# Patient Record
Sex: Female | Born: 1945 | Race: White | Hispanic: No | Smoking: Never smoker
Health system: Southern US, Community
[De-identification: ages and names within clinical notes are randomized; demographics above are authoritative.]

## PROBLEM LIST (undated history)

## (undated) DIAGNOSIS — K219 Gastro-esophageal reflux disease without esophagitis: Secondary | ICD-10-CM

## (undated) DIAGNOSIS — E7800 Pure hypercholesterolemia, unspecified: Secondary | ICD-10-CM

## (undated) DIAGNOSIS — F32A Depression, unspecified: Secondary | ICD-10-CM

## (undated) DIAGNOSIS — J302 Other seasonal allergic rhinitis: Secondary | ICD-10-CM

## (undated) DIAGNOSIS — J45909 Unspecified asthma, uncomplicated: Secondary | ICD-10-CM

## (undated) DIAGNOSIS — Z91018 Allergy to other foods: Secondary | ICD-10-CM

## (undated) DIAGNOSIS — G709 Myoneural disorder, unspecified: Secondary | ICD-10-CM

## (undated) DIAGNOSIS — F329 Major depressive disorder, single episode, unspecified: Secondary | ICD-10-CM

## (undated) DIAGNOSIS — R0602 Shortness of breath: Secondary | ICD-10-CM

## (undated) DIAGNOSIS — R112 Nausea with vomiting, unspecified: Secondary | ICD-10-CM

## (undated) DIAGNOSIS — M1990 Unspecified osteoarthritis, unspecified site: Secondary | ICD-10-CM

## (undated) DIAGNOSIS — Z9889 Other specified postprocedural states: Secondary | ICD-10-CM

## (undated) HISTORY — PX: DILATION AND CURETTAGE OF UTERUS: SHX78

---

## 1972-11-02 HISTORY — PX: GROIN MASS OPEN BIOPSY: SHX1714

## 1995-11-03 HISTORY — PX: APPENDECTOMY: SHX54

## 1995-11-03 HISTORY — PX: ABDOMINAL HYSTERECTOMY: SHX81

## 1997-11-02 HISTORY — PX: REFRACTIVE SURGERY: SHX103

## 2012-10-19 ENCOUNTER — Other Ambulatory Visit: Payer: Self-pay | Admitting: Orthopedic Surgery

## 2012-10-20 ENCOUNTER — Encounter (HOSPITAL_COMMUNITY): Payer: Self-pay

## 2012-10-20 ENCOUNTER — Encounter (HOSPITAL_COMMUNITY)
Admission: RE | Admit: 2012-10-20 | Discharge: 2012-10-20 | Disposition: A | Discharge status: Home / Self Care | Payer: Medicare Other | Source: Ambulatory Visit | Attending: Orthopedic Surgery | Admitting: Orthopedic Surgery

## 2012-10-20 ENCOUNTER — Ambulatory Visit (HOSPITAL_COMMUNITY)
Admission: RE | Admit: 2012-10-20 | Discharge: 2012-10-20 | Disposition: A | Discharge status: Home / Self Care | Payer: Medicare Other | Source: Ambulatory Visit | Attending: Orthopedic Surgery | Admitting: Orthopedic Surgery

## 2012-10-20 DIAGNOSIS — Z01810 Encounter for preprocedural cardiovascular examination: Secondary | ICD-10-CM | POA: Insufficient documentation

## 2012-10-20 DIAGNOSIS — M1710 Unilateral primary osteoarthritis, unspecified knee: Secondary | ICD-10-CM | POA: Insufficient documentation

## 2012-10-20 DIAGNOSIS — F17200 Nicotine dependence, unspecified, uncomplicated: Secondary | ICD-10-CM | POA: Insufficient documentation

## 2012-10-20 DIAGNOSIS — Z01812 Encounter for preprocedural laboratory examination: Secondary | ICD-10-CM | POA: Insufficient documentation

## 2012-10-20 HISTORY — DX: Unspecified osteoarthritis, unspecified site: M19.90

## 2012-10-20 HISTORY — DX: Other specified postprocedural states: R11.2

## 2012-10-20 HISTORY — DX: Myoneural disorder, unspecified: G70.9

## 2012-10-20 HISTORY — DX: Other specified postprocedural states: Z98.890

## 2012-10-20 HISTORY — DX: Nausea with vomiting, unspecified: R11.2

## 2012-10-20 LAB — URINALYSIS, ROUTINE W REFLEX MICROSCOPIC
Ketones, ur: NEGATIVE mg/dL
Leukocytes, UA: NEGATIVE
Nitrite: NEGATIVE
Protein, ur: NEGATIVE mg/dL
pH: 7.5 (ref 5.0–8.0)

## 2012-10-20 LAB — CBC WITH DIFFERENTIAL/PLATELET
Basophils Absolute: 0.1 10*3/uL (ref 0.0–0.1)
Eosinophils Absolute: 0.1 10*3/uL (ref 0.0–0.7)
Eosinophils Relative: 2 % (ref 0–5)
HCT: 41.6 % (ref 36.0–46.0)
Lymphocytes Relative: 27 % (ref 12–46)
MCH: 30 pg (ref 26.0–34.0)
MCHC: 33.7 g/dL (ref 30.0–36.0)
MCV: 89.1 fL (ref 78.0–100.0)
Monocytes Absolute: 0.5 10*3/uL (ref 0.1–1.0)
RDW: 12.8 % (ref 11.5–15.5)

## 2012-10-20 LAB — ABO/RH: ABO/RH(D): O POS

## 2012-10-20 LAB — COMPREHENSIVE METABOLIC PANEL
AST: 20 U/L (ref 0–37)
CO2: 29 mEq/L (ref 19–32)
Calcium: 9.7 mg/dL (ref 8.4–10.5)
Creatinine, Ser: 0.67 mg/dL (ref 0.50–1.10)
GFR calc Af Amer: >90 mL/min (ref >90)
GFR calc non Af Amer: 90 mL/min — ABNORMAL LOW (ref >90)
Total Protein: 7.2 g/dL (ref 6.0–8.3)

## 2012-10-20 LAB — TYPE AND SCREEN
ABO/RH(D): O POS
Antibody Screen: NEGATIVE

## 2012-10-20 LAB — PROTIME-INR: INR: 0.92 (ref 0.00–1.49)

## 2012-10-20 LAB — SURGICAL PCR SCREEN: MRSA, PCR: NEGATIVE

## 2012-10-20 MED ORDER — CHLORHEXIDINE GLUCONATE 4 % EX LIQD: 60.0000 mL | Freq: Once | CUTANEOUS | Status: DC

## 2012-10-20 NOTE — Pre-Procedure Instructions (Signed)
20 Annette Tran  10/20/2012   Your procedure is scheduled on:  Monday October 24, 2012  Report to [facility] at 6:45 AM.  Call this number if you have problems the morning of surgery: [phone]   Remember:   Do not eat food or drink :After Midnight.    Take these medicines the morning of surgery with A SIP OF WATER: none   Do not wear jewelry, make-up or nail polish.  Do not wear lotions, powders, or perfumes.   Do not shave 48 hours prior to surgery.   Do not bring valuables to the hospital.  Contacts, dentures or bridgework may not be worn into surgery.  Leave suitcase in the car. After surgery it may be brought to your room.  For patients admitted to the hospital, checkout time is 11:00 AM the day of discharge.   Patients discharged the day of surgery will not be allowed to drive home.  Name and phone number of your driver: family / friend  Special Instructions: Shower using CHG 2 nights before surgery and the night before surgery.  If you shower the day of surgery use CHG.  Use special wash - you have one bottle of CHG for all showers.  You should use approximately 1/3 of the bottle for each shower.   Please read over the following fact sheets that you were given: Pain Booklet, Coughing and Deep Breathing, Blood Transfusion Information, Total Joint Packet, MRSA Information and Surgical Site Infection Prevention

## 2012-10-23 MED ORDER — CEFAZOLIN SODIUM-DEXTROSE 2-3 GM-% IV SOLR
2.0000 g | INTRAVENOUS | Status: AC
  Administered 2012-10-24: 2 g via INTRAVENOUS

## 2012-10-24 ENCOUNTER — Inpatient Hospital Stay (HOSPITAL_COMMUNITY)
Admission: RE | Admit: 2012-10-24 | Discharge: 2012-10-26 | DRG: 470 | Disposition: A | Discharge status: Home Health | Payer: Medicare Other | Source: Ambulatory Visit | Attending: Orthopedic Surgery | Admitting: Orthopedic Surgery

## 2012-10-24 ENCOUNTER — Encounter (HOSPITAL_COMMUNITY): Payer: Self-pay | Admitting: Anesthesiology

## 2012-10-24 ENCOUNTER — Encounter (HOSPITAL_COMMUNITY): Payer: Self-pay | Admitting: *Deleted

## 2012-10-24 ENCOUNTER — Ambulatory Visit (HOSPITAL_COMMUNITY): Payer: Medicare Other | Admitting: Anesthesiology

## 2012-10-24 ENCOUNTER — Encounter (HOSPITAL_COMMUNITY)
Admission: RE | Disposition: A | Discharge status: Home Health | Payer: Self-pay | Source: Ambulatory Visit | Attending: Orthopedic Surgery

## 2012-10-24 DIAGNOSIS — Z87891 Personal history of nicotine dependence: Secondary | ICD-10-CM

## 2012-10-24 DIAGNOSIS — D62 Acute posthemorrhagic anemia: Secondary | ICD-10-CM | POA: Diagnosis not present

## 2012-10-24 DIAGNOSIS — Z90710 Acquired absence of both cervix and uterus: Secondary | ICD-10-CM

## 2012-10-24 DIAGNOSIS — Z881 Allergy status to other antibiotic agents status: Secondary | ICD-10-CM

## 2012-10-24 DIAGNOSIS — Z79899 Other long term (current) drug therapy: Secondary | ICD-10-CM

## 2012-10-24 DIAGNOSIS — M1711 Unilateral primary osteoarthritis, right knee: Secondary | ICD-10-CM

## 2012-10-24 DIAGNOSIS — Z885 Allergy status to narcotic agent status: Secondary | ICD-10-CM

## 2012-10-24 DIAGNOSIS — M1710 Unilateral primary osteoarthritis, unspecified knee: Principal | ICD-10-CM | POA: Diagnosis present

## 2012-10-24 HISTORY — PX: TOTAL KNEE ARTHROPLASTY: SHX125

## 2012-10-24 SURGERY — ARTHROPLASTY, KNEE, TOTAL
Anesthesia: Regional | Site: Knee | Laterality: Right | Wound class: Clean

## 2012-10-24 MED ORDER — DOCUSATE SODIUM 100 MG PO CAPS
100.0000 mg | ORAL_CAPSULE | Freq: Two times a day (BID) | ORAL | Status: DC
  Administered 2012-10-24 – 2012-10-26 (×5): 100 mg via ORAL
  Filled 2012-10-24 (×4): qty 1

## 2012-10-24 MED ORDER — BUPIVACAINE 0.25 % ON-Q PUMP SINGLE CATH 300ML
300.0000 mL | INJECTION | Status: DC
  Filled 2012-10-24: qty 300

## 2012-10-24 MED ORDER — ENOXAPARIN SODIUM 30 MG/0.3ML ~~LOC~~ SOLN
30.0000 mg | Freq: Two times a day (BID) | SUBCUTANEOUS | Status: DC
  Administered 2012-10-25 – 2012-10-26 (×3): 30 mg via SUBCUTANEOUS
  Filled 2012-10-24 (×5): qty 0.3

## 2012-10-24 MED ORDER — ONDANSETRON HCL 4 MG/2ML IJ SOLN
4.0000 mg | Freq: Four times a day (QID) | INTRAMUSCULAR | Status: DC | PRN
  Administered 2012-10-24 – 2012-10-26 (×2): 4 mg via INTRAVENOUS
  Filled 2012-10-24 (×2): qty 2

## 2012-10-24 MED ORDER — ACETAMINOPHEN 10 MG/ML IV SOLN
1000.0000 mg | Freq: Four times a day (QID) | INTRAVENOUS | Status: AC
  Administered 2012-10-24 – 2012-10-25 (×3): 1000 mg via INTRAVENOUS
  Filled 2012-10-24 (×4): qty 100

## 2012-10-24 MED ORDER — ACETAMINOPHEN 10 MG/ML IV SOLN
INTRAVENOUS | Status: AC
  Filled 2012-10-24: qty 100

## 2012-10-24 MED ORDER — FENTANYL CITRATE 0.05 MG/ML IJ SOLN
INTRAMUSCULAR | Status: DC | PRN
  Administered 2012-10-24 (×5): 50 ug via INTRAVENOUS

## 2012-10-24 MED ORDER — FLEET ENEMA 7-19 GM/118ML RE ENEM: 1.0000 | ENEMA | Freq: Once | RECTAL | Status: AC | PRN

## 2012-10-24 MED ORDER — OXYCODONE HCL 5 MG PO TABS
5.0000 mg | ORAL_TABLET | ORAL | Status: DC | PRN
  Administered 2012-10-25 – 2012-10-26 (×9): 10 mg via ORAL
  Filled 2012-10-24 (×9): qty 2

## 2012-10-24 MED ORDER — CELECOXIB 200 MG PO CAPS
200.0000 mg | ORAL_CAPSULE | Freq: Two times a day (BID) | ORAL | Status: DC
  Administered 2012-10-24 – 2012-10-26 (×4): 200 mg via ORAL
  Filled 2012-10-24 (×5): qty 1

## 2012-10-24 MED ORDER — PROPOFOL 10 MG/ML IV BOLUS
INTRAVENOUS | Status: DC | PRN
  Administered 2012-10-24: 180 mg via INTRAVENOUS

## 2012-10-24 MED ORDER — SODIUM CHLORIDE 0.9 % IR SOLN
Status: DC | PRN
  Administered 2012-10-24: 3000 mL

## 2012-10-24 MED ORDER — OXYCODONE HCL 5 MG PO TABS
ORAL_TABLET | ORAL | Status: AC
  Filled 2012-10-24: qty 1

## 2012-10-24 MED ORDER — LACTATED RINGERS IV SOLN
INTRAVENOUS | Status: DC | PRN
  Administered 2012-10-24 (×2): via INTRAVENOUS

## 2012-10-24 MED ORDER — SODIUM CHLORIDE 0.9 % IV SOLN: INTRAVENOUS | Status: DC

## 2012-10-24 MED ORDER — ALUM & MAG HYDROXIDE-SIMETH 200-200-20 MG/5ML PO SUSP
30.0000 mL | ORAL | Status: DC | PRN
  Filled 2012-10-24: qty 30

## 2012-10-24 MED ORDER — SENNOSIDES-DOCUSATE SODIUM 8.6-50 MG PO TABS: 1.0000 | ORAL_TABLET | Freq: Every evening | ORAL | Status: DC | PRN

## 2012-10-24 MED ORDER — BUPIVACAINE-EPINEPHRINE PF 0.5-1:200000 % IJ SOLN
INTRAMUSCULAR | Status: DC | PRN
  Administered 2012-10-24: 30 mL

## 2012-10-24 MED ORDER — LIDOCAINE HCL (CARDIAC) 20 MG/ML IV SOLN
INTRAVENOUS | Status: DC | PRN
  Administered 2012-10-24: 50 mg via INTRAVENOUS

## 2012-10-24 MED ORDER — BISACODYL 5 MG PO TBEC
5.0000 mg | DELAYED_RELEASE_TABLET | Freq: Every day | ORAL | Status: DC | PRN
  Administered 2012-10-25: 5 mg via ORAL
  Filled 2012-10-24: qty 1

## 2012-10-24 MED ORDER — SODIUM CHLORIDE 0.9 % IV SOLN
INTRAVENOUS | Status: DC
Start: 2012-10-24 — End: 2012-10-26
  Administered 2012-10-24 – 2012-10-25 (×3): via INTRAVENOUS

## 2012-10-24 MED ORDER — CEFAZOLIN SODIUM-DEXTROSE 2-3 GM-% IV SOLR
INTRAVENOUS | Status: AC
  Filled 2012-10-24: qty 50

## 2012-10-24 MED ORDER — CEFAZOLIN SODIUM-DEXTROSE 2-3 GM-% IV SOLR
2.0000 g | Freq: Four times a day (QID) | INTRAVENOUS | Status: AC
  Administered 2012-10-24 (×2): 2 g via INTRAVENOUS
  Filled 2012-10-24 (×2): qty 50

## 2012-10-24 MED ORDER — OXYCODONE HCL 5 MG PO TABS
5.0000 mg | ORAL_TABLET | Freq: Once | ORAL | Status: AC | PRN
  Administered 2012-10-24: 5 mg via ORAL

## 2012-10-24 MED ORDER — ONDANSETRON HCL 4 MG/2ML IJ SOLN: 4.0000 mg | Freq: Once | INTRAMUSCULAR | Status: DC | PRN

## 2012-10-24 MED ORDER — METHOCARBAMOL 500 MG PO TABS
500.0000 mg | ORAL_TABLET | Freq: Four times a day (QID) | ORAL | Status: DC | PRN
  Administered 2012-10-25 – 2012-10-26 (×4): 500 mg via ORAL
  Filled 2012-10-24 (×5): qty 1

## 2012-10-24 MED ORDER — ACETAMINOPHEN 10 MG/ML IV SOLN
1000.0000 mg | Freq: Four times a day (QID) | INTRAVENOUS | Status: DC
  Administered 2012-10-24: 1000 mg via INTRAVENOUS
  Filled 2012-10-24 (×3): qty 100

## 2012-10-24 MED ORDER — EPHEDRINE SULFATE 50 MG/ML IJ SOLN
INTRAMUSCULAR | Status: DC | PRN
  Administered 2012-10-24: 5 mg via INTRAVENOUS

## 2012-10-24 MED ORDER — CHLORHEXIDINE GLUCONATE 4 % EX LIQD: 60.0000 mL | Freq: Once | CUTANEOUS | Status: DC

## 2012-10-24 MED ORDER — METOCLOPRAMIDE HCL 10 MG PO TABS
5.0000 mg | ORAL_TABLET | Freq: Three times a day (TID) | ORAL | Status: DC | PRN
  Administered 2012-10-25: 10 mg via ORAL
  Filled 2012-10-24: qty 1

## 2012-10-24 MED ORDER — HYDROMORPHONE HCL PF 1 MG/ML IJ SOLN
0.2500 mg | INTRAMUSCULAR | Status: DC | PRN
  Administered 2012-10-24 (×2): 0.5 mg via INTRAVENOUS

## 2012-10-24 MED ORDER — DIPHENHYDRAMINE HCL 12.5 MG/5ML PO ELIX
12.5000 mg | ORAL_SOLUTION | ORAL | Status: DC | PRN
  Administered 2012-10-25: 25 mg via ORAL
  Filled 2012-10-24: qty 50

## 2012-10-24 MED ORDER — OXYCODONE HCL 5 MG/5ML PO SOLN: 5.0000 mg | Freq: Once | ORAL | Status: AC | PRN

## 2012-10-24 MED ORDER — ONDANSETRON HCL 4 MG PO TABS: 4.0000 mg | ORAL_TABLET | Freq: Four times a day (QID) | ORAL | Status: DC | PRN

## 2012-10-24 MED ORDER — ACETAMINOPHEN 650 MG RE SUPP: 650.0000 mg | Freq: Four times a day (QID) | RECTAL | Status: DC | PRN

## 2012-10-24 MED ORDER — MEPERIDINE HCL 25 MG/ML IJ SOLN: 6.2500 mg | INTRAMUSCULAR | Status: DC | PRN

## 2012-10-24 MED ORDER — BUPIVACAINE-EPINEPHRINE 0.25% -1:200000 IJ SOLN
INTRAMUSCULAR | Status: DC | PRN
  Administered 2012-10-24: 30 mL

## 2012-10-24 MED ORDER — PHENOL 1.4 % MT LIQD: 1.0000 | OROMUCOSAL | Status: DC | PRN

## 2012-10-24 MED ORDER — HYDROMORPHONE HCL PF 1 MG/ML IJ SOLN
0.5000 mg | INTRAMUSCULAR | Status: DC | PRN
  Administered 2012-10-24 – 2012-10-26 (×3): 0.5 mg via INTRAVENOUS
  Filled 2012-10-24 (×3): qty 1

## 2012-10-24 MED ORDER — ACETAMINOPHEN 325 MG PO TABS: 650.0000 mg | ORAL_TABLET | Freq: Four times a day (QID) | ORAL | Status: DC | PRN

## 2012-10-24 MED ORDER — HYDROMORPHONE HCL PF 1 MG/ML IJ SOLN
INTRAMUSCULAR | Status: AC
  Administered 2012-10-24: 0.5 mg via INTRAVENOUS
  Filled 2012-10-24: qty 1

## 2012-10-24 MED ORDER — METOCLOPRAMIDE HCL 5 MG/ML IJ SOLN
5.0000 mg | Freq: Three times a day (TID) | INTRAMUSCULAR | Status: DC | PRN
  Administered 2012-10-24: 10 mg via INTRAVENOUS
  Filled 2012-10-24: qty 2

## 2012-10-24 MED ORDER — BUPIVACAINE 0.25 % ON-Q PUMP SINGLE CATH 300ML
INJECTION | Status: DC | PRN
  Administered 2012-10-24: 300 mL

## 2012-10-24 MED ORDER — ONDANSETRON HCL 4 MG/2ML IJ SOLN
INTRAMUSCULAR | Status: DC | PRN
  Administered 2012-10-24: 4 mg via INTRAVENOUS

## 2012-10-24 MED ORDER — PHENYLEPHRINE HCL 10 MG/ML IJ SOLN
INTRAMUSCULAR | Status: DC | PRN
  Administered 2012-10-24 (×6): 40 ug via INTRAVENOUS

## 2012-10-24 MED ORDER — MIDAZOLAM HCL 5 MG/5ML IJ SOLN
INTRAMUSCULAR | Status: DC | PRN
  Administered 2012-10-24: 2 mg via INTRAVENOUS

## 2012-10-24 MED ORDER — BUPIVACAINE ON-Q PAIN PUMP (FOR ORDER SET NO CHG)
INJECTION | Status: DC
  Filled 2012-10-24: qty 1

## 2012-10-24 MED ORDER — 0.9 % SODIUM CHLORIDE (POUR BTL) OPTIME
TOPICAL | Status: DC | PRN
  Administered 2012-10-24: 1000 mL

## 2012-10-24 MED ORDER — DEXTROSE 5 % IV SOLN
500.0000 mg | Freq: Four times a day (QID) | INTRAVENOUS | Status: DC | PRN
  Administered 2012-10-24: 500 mg via INTRAVENOUS
  Filled 2012-10-24: qty 5

## 2012-10-24 MED ORDER — MENTHOL 3 MG MT LOZG
1.0000 | LOZENGE | OROMUCOSAL | Status: DC | PRN
  Filled 2012-10-24: qty 9

## 2012-10-24 MED ORDER — BUPIVACAINE-EPINEPHRINE PF 0.25-1:200000 % IJ SOLN
INTRAMUSCULAR | Status: AC
  Filled 2012-10-24: qty 30

## 2012-10-24 MED ORDER — ZOLPIDEM TARTRATE 5 MG PO TABS: 5.0000 mg | ORAL_TABLET | Freq: Every evening | ORAL | Status: DC | PRN

## 2012-10-24 MED ORDER — OXYCODONE HCL ER 10 MG PO T12A
10.0000 mg | EXTENDED_RELEASE_TABLET | Freq: Two times a day (BID) | ORAL | Status: DC
  Administered 2012-10-24 – 2012-10-26 (×5): 10 mg via ORAL
  Filled 2012-10-24 (×5): qty 1

## 2012-10-24 SURGICAL SUPPLY — 62 items
BANDAGE ELASTIC 6 VELCRO ST LF (GAUZE/BANDAGES/DRESSINGS) ×2 IMPLANT
BANDAGE ESMARK 6X9 LF (GAUZE/BANDAGES/DRESSINGS) ×1 IMPLANT
BLADE SAGITTAL 13X1.27X60 (BLADE) ×2 IMPLANT
BLADE SAW SGTL 83.5X18.5 (BLADE) ×2 IMPLANT
BNDG ESMARK 6X9 LF (GAUZE/BANDAGES/DRESSINGS) ×2
BOWL SMART MIX CTS (DISPOSABLE) ×2 IMPLANT
CATH KIT ON Q 5IN SLV (PAIN MANAGEMENT) ×2 IMPLANT
CEMENT BONE SIMPLEX SPEEDSET (Cement) ×4 IMPLANT
CLOTH BEACON ORANGE TIMEOUT ST (SAFETY) ×2 IMPLANT
COVER BACK TABLE 24X17X13 BIG (DRAPES) IMPLANT
COVER SURGICAL LIGHT HANDLE (MISCELLANEOUS) ×2 IMPLANT
CUFF TOURNIQUET SINGLE 34IN LL (TOURNIQUET CUFF) ×2 IMPLANT
DRAPE EXTREMITY T 121X128X90 (DRAPE) ×2 IMPLANT
DRAPE INCISE IOBAN 66X45 STRL (DRAPES) ×4 IMPLANT
DRAPE PROXIMA HALF (DRAPES) ×2 IMPLANT
DRAPE U-SHAPE 47X51 STRL (DRAPES) ×2 IMPLANT
DRSG ADAPTIC 3X8 NADH LF (GAUZE/BANDAGES/DRESSINGS) ×2 IMPLANT
DRSG PAD ABDOMINAL 8X10 ST (GAUZE/BANDAGES/DRESSINGS) ×2 IMPLANT
DURAPREP 26ML APPLICATOR (WOUND CARE) ×4 IMPLANT
ELECT REM PT RETURN 9FT ADLT (ELECTROSURGICAL) ×2
ELECTRODE REM PT RTRN 9FT ADLT (ELECTROSURGICAL) ×1 IMPLANT
EVACUATOR 1/8 PVC DRAIN (DRAIN) ×2 IMPLANT
GLOVE BIOGEL M 7.0 STRL (GLOVE) IMPLANT
GLOVE BIOGEL PI IND STRL 7.5 (GLOVE) IMPLANT
GLOVE BIOGEL PI IND STRL 8.5 (GLOVE) ×2 IMPLANT
GLOVE BIOGEL PI INDICATOR 7.5 (GLOVE)
GLOVE BIOGEL PI INDICATOR 8.5 (GLOVE) ×2
GLOVE BIOGEL PI ORTHO PRO 7.5 (GLOVE) ×1
GLOVE BIOGEL PI ORTHO PRO SZ8 (GLOVE)
GLOVE PI ORTHO PRO STRL 7.5 (GLOVE) ×1 IMPLANT
GLOVE PI ORTHO PRO STRL SZ8 (GLOVE) IMPLANT
GLOVE SURG ORTHO 8.0 STRL STRW (GLOVE) ×8 IMPLANT
GLOVE SURG SS PI 7.5 STRL IVOR (GLOVE) ×6 IMPLANT
GOWN PREVENTION PLUS XLARGE (GOWN DISPOSABLE) ×4 IMPLANT
GOWN STRL NON-REIN LRG LVL3 (GOWN DISPOSABLE) IMPLANT
GOWN STRL REIN XL XLG (GOWN DISPOSABLE) ×2 IMPLANT
HANDPIECE INTERPULSE COAX TIP (DISPOSABLE) ×1
HOOD PEEL AWAY FACE SHEILD DIS (HOOD) ×6 IMPLANT
KIT BASIN OR (CUSTOM PROCEDURE TRAY) ×2 IMPLANT
KIT ROOM TURNOVER OR (KITS) ×2 IMPLANT
MANIFOLD NEPTUNE II (INSTRUMENTS) ×2 IMPLANT
NEEDLE 22X1 1/2 (OR ONLY) (NEEDLE) ×2 IMPLANT
NS IRRIG 1000ML POUR BTL (IV SOLUTION) ×2 IMPLANT
PACK TOTAL JOINT (CUSTOM PROCEDURE TRAY) ×2 IMPLANT
PAD ARMBOARD 7.5X6 YLW CONV (MISCELLANEOUS) ×2 IMPLANT
PADDING CAST COTTON 6X4 STRL (CAST SUPPLIES) ×2 IMPLANT
POSITIONER HEAD PRONE TRACH (MISCELLANEOUS) ×2 IMPLANT
SET HNDPC FAN SPRY TIP SCT (DISPOSABLE) ×1 IMPLANT
SPONGE GAUZE 4X4 12PLY (GAUZE/BANDAGES/DRESSINGS) ×2 IMPLANT
STAPLER VISISTAT 35W (STAPLE) ×2 IMPLANT
SUCTION FRAZIER TIP 10 FR DISP (SUCTIONS) ×2 IMPLANT
SUT BONE WAX W31G (SUTURE) ×2 IMPLANT
SUT VIC AB 0 CTB1 27 (SUTURE) ×4 IMPLANT
SUT VIC AB 1 CT1 27 (SUTURE) ×2
SUT VIC AB 1 CT1 27XBRD ANBCTR (SUTURE) ×2 IMPLANT
SUT VIC AB 2-0 CT1 27 (SUTURE) ×2
SUT VIC AB 2-0 CT1 TAPERPNT 27 (SUTURE) ×2 IMPLANT
SYR CONTROL 10ML LL (SYRINGE) IMPLANT
TOWEL OR 17X24 6PK STRL BLUE (TOWEL DISPOSABLE) ×2 IMPLANT
TOWEL OR 17X26 10 PK STRL BLUE (TOWEL DISPOSABLE) ×2 IMPLANT
TRAY FOLEY CATH 14FR (SET/KITS/TRAYS/PACK) ×2 IMPLANT
WATER STERILE IRR 1000ML POUR (IV SOLUTION) ×4 IMPLANT

## 2012-10-24 NOTE — Anesthesia Preprocedure Evaluation (Addendum)
Anesthesia Evaluation  Patient identified by MRN, date of birth, ID band Patient awake    Reviewed: Allergy & Precautions, H&P , NPO status , Patient's Chart, lab work & pertinent test results  History of Anesthesia Complications (+) PONV  Airway Mallampati: I TM Distance: >3 FB Neck ROM: Full    Dental  (+) Teeth Intact and Dental Advisory Given   Pulmonary Current Smoker,  breath sounds clear to auscultation        Cardiovascular Rhythm:Regular Rate:Normal     Neuro/Psych  Neuromuscular disease negative psych ROS   GI/Hepatic negative GI ROS, Neg liver ROS,   Endo/Other  negative endocrine ROS  Renal/GU negative Renal ROS     Musculoskeletal  (+) Arthritis -, Osteoarthritis,    Abdominal (+)  Abdomen: soft. Bowel sounds: normal.  Peds  Hematology negative hematology ROS (+)   Anesthesia Other Findings   Reproductive/Obstetrics negative OB ROS                        Anesthesia Physical Anesthesia Plan  ASA: II  Anesthesia Plan: General   Post-op Pain Management:    Induction: Intravenous  Airway Management Planned: Oral ETT  Additional Equipment:   Intra-op Plan:   Post-operative Plan: Extubation in OR  Informed Consent: I have reviewed the patients History and Physical, chart, labs and discussed the procedure including the risks, benefits and alternatives for the proposed anesthesia with the patient or authorized representative who has indicated his/her understanding and acceptance.   Dental advisory given  Plan Discussed with: Surgeon and CRNA  Anesthesia Plan Comments:        Anesthesia Quick Evaluation

## 2012-10-24 NOTE — Preoperative (Signed)
Beta Blockers   Reason not to administer Beta Blockers:Not Applicable 

## 2012-10-24 NOTE — Op Note (Signed)
TOTAL KNEE REPLACEMENT OPERATIVE NOTE:  10/24/2012  10:46 AM  PATIENT:  Annette Tran  66 y.o. female  PRE-OPERATIVE DIAGNOSIS:  osteoarthrits right knee  POST-OPERATIVE DIAGNOSIS:  osteoarthrits right knee  PROCEDURE:  Procedure(s): TOTAL KNEE ARTHROPLASTY  SURGEON:  Surgeon(s): Dannielle Huh, MD  PHYSICIAN ASSISTANT: Altamese Cabal, [facility]  ANESTHESIA:   general  DRAINS: Hemovac and On-Q Marcaine Pain Pump  SPECIMEN: None  COUNTS:  Correct  TOURNIQUET:   Total Tourniquet Time Documented: Thigh (Right) - 48 minutes  DICTATION:  Indication for procedure:    The patient is a 66 y.o. female who has failed conservative treatment for osteoarthrits right knee.  Informed consent was obtained prior to anesthesia. The risks versus benefits of the operation were explain and in a way the patient can, and did, understand.   Description of procedure:     The patient was taken to the operating room and placed under anesthesia.  The patient was positioned in the usual fashion taking care that all body parts were adequately padded and/or protected.  I foley catheter was placed.  A tourniquet was applied and the leg prepped and draped in the usual sterile fashion.  The extremity was exsanguinated with the esmarch and tourniquet inflated to 350 mmHg.  Pre-operative range of motion was normal.  The knee was in 5 degree of mild varus.  A midline incision approximately 6-7 inches long was made with a #10 blade.  A new blade was used to make a parapatellar arthrotomy going 2-3 cm into the quadriceps tendon, over the patella, and alongside the medial aspect of the patellar tendon.  A synovectomy was then performed with the #10 blade and forceps. I then elevated the deep MCL off the medial tibial metaphysis subperiosteally around to the semimembranosus attachment.    I everted the patella and used calipers to measure patellar thickness.  I used the reamer to ream down to appropriate thickness to  recreate the native thickness.  I then removed excess bone with the rongeur and sagittal saw.  I used the appropriately sized template and drilled the three lug holes.  I then put the trial in place and measured the thickness with the calipers to ensure recreation of the native thickness.  The trial was then removed and the patella subluxed and the knee brought into flexion.  A homan retractor was place to retract and protect the patella and lateral structures.  A Z-retractor was place medially to protect the medial structures.  The extra-medullary alignment system was used to make cut the tibial articular surface perpendicular to the anamotic axis of the tibia and in 3 degrees of posterior slope.  The cut surface and alignment jig was removed.  I then used the intramedullary alignment guide to make a 6 valgus cut on the distal femur.  I then marked out the epicondylar axis on the distal femur.  The posterior condylar axis measured 3 degrees.  I then used the anterior referencing sizer and measured the femur to be a size C.  The 4-In-1 cutting block was screwed into place in external rotation matching the posterior condylar angle, making our cuts perpendicular to the epicondylar axis.  Anterior, posterior and chamfer cuts were made with the sagittal saw.  The cutting block and cut pieces were removed.  A lamina spreader was placed in 90 degrees of flexion.  The ACL, PCL, menisci, and posterior condylar osteophytes were removed.  A 12 mm spacer blocked was found to offer good flexion and extension  gap balance after moderate in degree releasing.   The scoop retractor was then placed and the femoral finishing block was pinned in place.  The small sagittal saw was used as well as the lug drill to finish the femur.  The block and cut surfaces were removed and the medullary canal hole filled with autograft bone from the cut pieces.  The tibia was delivered forward in deep flexion and external rotation.  A size 3  tray was selected and pinned into place centered on the medial 1/3 of the tibial tubercle.  The reamer and keel was used to prepare the tibia through the tray.    I then trialed with the size C femur, size 3 tibia, a 12 mm insert and the 32 patella.  I had excellent flexion/extension gap balance, excellent patella tracking.  Flexion was full and beyond 120 degrees; extension was zero.  These components were chosen and the staff opened them to me on the back table while the knee was lavaged copiously and the cement mixed.  I cemented in the components and removed all excess cement.  The polyethylene tibial component was snapped into place and the knee placed in extension while cement was hardening.  The capsule was infilltrated with 20cc of .25% Marcaine with epinephrine.  A hemovac was place in the joint exiting superolaterally.  A pain pump was place superomedially superficial to the arthrotomy.  Once the cement was hard, the tourniquet was let down.  Hemostasis was obtained.  The arthrotomy was closed with figure-8 #1 vicryl sutures.  The deep soft tissues were closed with #0 vicryls and the subcuticular layer closed with a running #2-0 vicryl.  The skin was reapproximated and closed with skin staples.  The wound was dressed with xeroform, 4 x4's, 2 ABD sponges, a single layer of webril and a TED stocking.   The patient was then awakened, extubated, and taken to the recovery room in stable condition.  BLOOD LOSS:  300cc DRAINS: 1 hemovac, 1 pain catheter COMPLICATIONS:  None.  PLAN OF CARE: Admit to inpatient   PATIENT DISPOSITION:  PACU - hemodynamically stable.   Delay start of Pharmacological VTE agent (>24hrs) due to surgical blood loss or risk of bleeding:  not applicable  Please fax a copy of this op note to my office at [phone] (please only include page 1 and 2 of the Case Information op note)

## 2012-10-24 NOTE — Evaluation (Signed)
Physical Therapy Evaluation Patient Details Name: Annette Tran MRN: [id number] DOB: November 18, 1945 Today's Date: 10/24/2012 Time: 4098-1191 PT Time Calculation (min): 26 min  PT Assessment / Plan / Recommendation Clinical Impression  Patient is a 66 yo female s/p Rt. TKA.  Patient with decreased ROM, strength, and mobility.  Patient will benefit from acute PT to maximize independence prior to return home. Patient very motivated to work with PT.    PT Assessment  Patient needs continued PT services    Follow Up Recommendations  Home health PT;Supervision/Assistance - 24 hour    Does the patient have the potential to tolerate intense rehabilitation      Barriers to Discharge None      Equipment Recommendations  None recommended by PT    Recommendations for Other Services     Frequency 7X/week    Precautions / Restrictions Precautions Precautions: Knee Precaution Booklet Issued: Yes (comment) Precaution Comments: Reviewed precautions with patient and caregiver. Restrictions Weight Bearing Restrictions: Yes RLE Weight Bearing: Weight bearing as tolerated   Pertinent Vitals/Pain       Mobility  Bed Mobility Bed Mobility: Supine to Sit;Sitting - Scoot to Edge of Bed;Sit to Supine Supine to Sit: 4: Min assist;With rails;HOB flat Sitting - Scoot to Edge of Bed: 4: Min guard Sit to Supine: 4: Min assist;HOB flat Details for Bed Mobility Assistance: Verbal cues for technique.  Assist to move RLE off of and onto bed.  Patient sat at EOB x 10 minutes with good balance.  Patient became nauseated and vomited.  RN to room with medication. Transfers Transfers: Sit to Stand;Stand to Sit Sit to Stand: 4: Min assist;With upper extremity assist;From bed Stand to Sit: 4: Min assist;With upper extremity assist;To bed Details for Transfer Assistance: Verbal cues for technique.  Educated patient that RLE will not hold weight due to nerve block.  Instructed patient to use bil. UE's to push  up from bed and LLE to hold weight.  Patient stood x 45 seconds and returned to sitting. Ambulation/Gait Ambulation/Gait Assistance: Not tested (comment)       Exercises Total Joint Exercises Ankle Circles/Pumps: AROM;Both;10 reps;Supine   PT Diagnosis: Difficulty walking;Acute pain  PT Problem List: Decreased strength;Decreased range of motion;Decreased activity tolerance;Decreased balance;Decreased mobility;Decreased knowledge of use of DME;Decreased knowledge of precautions;Pain PT Treatment Interventions: DME instruction;Gait training;Stair training;Functional mobility training;Therapeutic exercise;Patient/family education   PT Goals Acute Rehab PT Goals PT Goal Formulation: With patient Time For Goal Achievement: 10/31/12 Potential to Achieve Goals: Good Pt will go Supine/Side to Sit: Independently;with HOB 0 degrees PT Goal: Supine/Side to Sit - Progress: Goal set today Pt will go Sit to Supine/Side: Independently;with HOB 0 degrees PT Goal: Sit to Supine/Side - Progress: Goal set today Pt will go Sit to Stand: with supervision;with upper extremity assist PT Goal: Sit to Stand - Progress: Goal set today Pt will Ambulate: >150 feet;with supervision;with rolling walker PT Goal: Ambulate - Progress: Goal set today Pt will Go Up / Down Stairs: 1-2 stairs;with min assist;with least restrictive assistive device PT Goal: Up/Down Stairs - Progress: Goal set today Pt will Perform Home Exercise Program: with supervision, verbal cues required/provided PT Goal: Perform Home Exercise Program - Progress: Goal set today  Visit Information  Last PT Received On: 10/24/12 Assistance Needed: +1    Subjective Data  Subjective: Patient c/o nausea with vomiting.  "I want to stand" - very motivated. Patient Stated Goal: To go home soon   Prior Functioning  Home Living Lives With: Alone  Available Help at Discharge: Friend(s);Available 24 hours/day (for 1 week) Type of Home: House Home  Access: Stairs to enter [employer] of Steps: 2 Entrance Stairs-Rails: None Home Layout: Two level;Able to live on main level with bedroom/bathroom Bathroom Shower/Tub: Engineer, manufacturing systems: Standard Bathroom Accessibility: Yes How Accessible: Accessible via walker Home Adaptive Equipment: Walker - rolling;Bedside commode/3-in-1 Prior Function Level of Independence: Independent Able to Take Stairs?: Yes Driving: Yes Vocation: Retired Musician: No difficulties    Cognition  Overall Cognitive Status: Appears within functional limits for tasks assessed/performed Arousal/Alertness: Awake/alert Orientation Level: Oriented X4 / Intact Behavior During Session: [facility] for tasks performed    Extremity/Trunk Assessment Right Upper Extremity Assessment RUE ROM/Strength/Tone: WFL for tasks assessed RUE Sensation: WFL - Light Touch Left Upper Extremity Assessment LUE ROM/Strength/Tone: WFL for tasks assessed LUE Sensation: WFL - Light Touch Right Lower Extremity Assessment RLE ROM/Strength/Tone: Deficits;Unable to fully assess;Due to pain RLE ROM/Strength/Tone Deficits: Able to assist moving RLE off of and onto bed. Left Lower Extremity Assessment LLE ROM/Strength/Tone: WFL for tasks assessed LLE Sensation: WFL - Light Touch Trunk Assessment Trunk Assessment: Normal   Balance Balance Balance Assessed: Yes Static Sitting Balance Static Sitting - Balance Support: No upper extremity supported;Feet supported Static Sitting - Level of Assistance: 5: Stand by assistance Static Sitting - Comment/# of Minutes: Patient sat EOB x 10 minutes with good balance.  Became nauseated and vomited.  RN to room with medication.  End of Session PT - End of Session Equipment Utilized During Treatment: Gait belt;Oxygen Activity Tolerance: Patient tolerated treatment well;Treatment limited secondary to medical complications (Comment) (N/V prior to and with  mobilization) Patient left: in bed;with call bell/phone within reach;with family/visitor present Nurse Communication: Mobility status (Request for nausea meds) CPM Right Knee CPM Right Knee: Off  GP     Vena Austria 10/24/2012, 6:12 PM Durenda Hurt. Renaldo Fiddler, [facility] Acute Rehab Services Pager [phone]

## 2012-10-24 NOTE — H&P (Signed)
Annette Tran MRN:  [id number] DOB/SEX:  09-08-1946/female  CHIEF COMPLAINT:  Painful right Knee  HISTORY: Patient is a 66 y.o. female presented with a history of pain in the right knee. Onset of symptoms was gradual starting several years ago with gradually worsening course since that time. The patient noted no past surgery on the right knee. Prior procedures on the knee include none. Patient has been treated conservatively with over-the-counter NSAIDs and activity modification. Patient currently rates pain in the knee at 8 out of 10 with activity. There is pain at night.  PAST MEDICAL HISTORY: There are no active problems to display for this patient.  Past Medical History  Diagnosis Date  . PONV (postoperative nausea and vomiting)   . Arthritis   . Neuromuscular disorder     abd tightness and numbness after c-section   Past Surgical History  Procedure Date  . Cesarean section 1975  . Eye surgery     [facility]  1999  . Abdominal hysterectomy 1997     MEDICATIONS:   Prescriptions prior to admission  Medication Sig Dispense Refill  . naproxen sodium (ALEVE) 220 MG tablet Take 220 mg by mouth 2 (two) times daily with a meal. For pain      . Red Yeast Rice 600 MG CAPS Take 2 capsules by mouth daily.        ALLERGIES:   Allergies  Allergen Reactions  . Demerol (Meperidine)     Hallucinations  . Doxycycline Palpitations    REVIEW OF SYSTEMS:  Pertinent items are noted in HPI.   FAMILY HISTORY:  History reviewed. No pertinent family history.  SOCIAL HISTORY:   History  Substance Use Topics  . Smoking status: Former Smoker    Types: Cigarettes  . Smokeless tobacco: Never Used  . Alcohol Use: No     EXAMINATION:  Vital signs in last 24 hours: Temp:  [97.9 F (36.6 C)] 97.9 F (36.6 C) (12/23 0701) Pulse Rate:  [82] 82  (12/23 0701) Resp:  [18] 18  (12/23 0701) BP: (128)/(84) 128/84 mmHg (12/23 0701) SpO2:  [96 %] 96 % (12/23 0701)  General appearance: alert,  cooperative and no distress Lungs: clear to auscultation bilaterally Heart: regular rate and rhythm, S1, S2 normal, no murmur, click, rub or gallop Abdomen: soft, non-tender; bowel sounds normal; no masses,  no organomegaly Extremities: extremities normal, atraumatic, no cyanosis or edema and Homans sign is negative, no sign of DVT Pulses: 2+ and symmetric Skin: Skin color, texture, turgor normal. No rashes or lesions Neurologic: Alert and oriented X 3, normal strength and tone. Normal symmetric reflexes. Normal coordination and gait  Musculoskeletal:  ROM 0-120, Ligaments intact,  Imaging Review Plain radiographs demonstrate severe degenerative joint disease of the right knee. The overall alignment is mild varus. The bone quality appears to be good for age and reported activity level.  Assessment/Plan: End stage arthritis, right knee   The patient history, physical examination and imaging studies are consistent with advanced degenerative joint disease of the right knee. The patient has failed conservative treatment.  The clearance notes were reviewed.  After discussion with the patient it was felt that Total Knee Replacement was indicated. The procedure,  risks, and benefits of total knee arthroplasty were presented and reviewed. The risks including but not limited to aseptic loosening, infection, blood clots, vascular injury, stiffness, patella tracking problems complications among others were discussed. The patient acknowledged the explanation, agreed to proceed with the plan.  Prudence Heiny 10/24/2012, 7:13 AM

## 2012-10-24 NOTE — Anesthesia Procedure Notes (Addendum)
Procedure Name: LMA Insertion Date/Time: 10/24/2012 8:58 AM Performed by: Ellin Goodie Pre-anesthesia Checklist: Patient identified, Emergency Drugs available, Suction available, Patient being monitored and Timeout performed Patient Re-evaluated:Patient Re-evaluated prior to inductionOxygen Delivery Method: Circle system utilized Preoxygenation: Pre-oxygenation with 100% oxygen Intubation Type: IV induction Ventilation: Mask ventilation without difficulty LMA: LMA with gastric port inserted LMA Size: 4.0 Number of attempts: 1 Placement Confirmation: breath sounds checked- equal and bilateral and positive ETCO2 Tube secured with: Tape Dental Injury: Teeth and Oropharynx as per pre-operative assessment    Anesthesia Regional Block:  Femoral nerve block  Pre-Anesthetic Checklist: ,, timeout performed, Correct Patient, Correct Site, Correct Laterality, Correct Procedure, Correct Position, site marked, Risks and benefits discussed,  Surgical consent,  Pre-op evaluation,  At surgeon's request and post-op pain management  Laterality: Right  Prep: chloraprep       Needles:  Injection technique: Single-shot  Needle Type: Echogenic Stimulator Needle     Needle Length:cm 9 cm Needle Gauge: 21 G    Additional Needles:  Procedures: ultrasound guided (picture in chart) and nerve stimulator Femoral nerve block  Nerve Stimulator or Paresthesia:  Response: 0.4 mA,   Additional Responses:   Narrative:  Start time: 10/24/2012 8:15 AM End time: 10/24/2012 8:30 AM Injection made incrementally with aspirations every 5 mL.  Performed by: Personally  Anesthesiologist: Arta Bruce MD  Additional Notes: Monitors applied. Patient sedated. Sterile prep and drape,hand hygiene and sterile gloves were used. Relevant anatomy identified.Needle position confirmed.Local anesthetic injected incrementally after negative aspiration. Local anesthetic spread visualized around nerve(s). Vascular  puncture avoided. No complications. Image printed for medical record.The patient tolerated the procedure well.       Femoral nerve block

## 2012-10-24 NOTE — Progress Notes (Signed)
Orthopedic Tech Progress Note Patient Details:  Annette Tran 1946/01/10 [id number]  CPM Right Knee CPM Right Knee: On Right Knee Flexion (Degrees): 90  Right Knee Extension (Degrees): 0  Additional Comments: trapeze bar   Cammer, Mickie Bail 10/24/2012, 1:43 PM

## 2012-10-24 NOTE — Transfer of Care (Signed)
Immediate Anesthesia Transfer of Care Note  Patient: Annette Tran  Procedure(s) Performed: Procedure(s) (LRB) with comments: TOTAL KNEE ARTHROPLASTY (Right) - right total knee arthroplasty  Patient Location: PACU  Anesthesia Type:General  Level of Consciousness: awake, alert  and oriented  Airway & Oxygen Therapy: Patient Spontanous Breathing  Post-op Assessment: Report given to PACU RN  Post vital signs: stable  Complications: No apparent anesthesia complications

## 2012-10-24 NOTE — Anesthesia Postprocedure Evaluation (Signed)
Anesthesia Post Note  Patient: Annette Tran  Procedure(s) Performed: Procedure(s) (LRB): TOTAL KNEE ARTHROPLASTY (Right)  Anesthesia type: general  Patient location: PACU  Post pain: Pain level controlled  Post assessment: Patient's Cardiovascular Status Stable  Last Vitals:  Filed Vitals:   10/24/12 1145  BP: 120/69  Pulse: 89  Temp:   Resp: 11    Post vital signs: Reviewed and stable  Level of consciousness: sedated  Complications: No apparent anesthesia complications

## 2012-10-25 ENCOUNTER — Encounter (HOSPITAL_COMMUNITY): Payer: Self-pay | Admitting: Orthopedic Surgery

## 2012-10-25 LAB — CBC
HCT: 30.3 % — ABNORMAL LOW (ref 36.0–46.0)
MCHC: 33.7 g/dL (ref 30.0–36.0)
MCV: 88.6 fL (ref 78.0–100.0)
RDW: 12.9 % (ref 11.5–15.5)

## 2012-10-25 LAB — BASIC METABOLIC PANEL
BUN: 9 mg/dL (ref 6–23)
Creatinine, Ser: 0.58 mg/dL (ref 0.50–1.10)
GFR calc Af Amer: >90 mL/min (ref >90)
GFR calc non Af Amer: >90 mL/min (ref >90)
Glucose, Bld: 108 mg/dL — ABNORMAL HIGH (ref 70–99)

## 2012-10-25 MED ORDER — CELECOXIB 200 MG PO CAPS: 200.0000 mg | ORAL_CAPSULE | Freq: Two times a day (BID) | ORAL | Status: DC

## 2012-10-25 MED ORDER — OXYCODONE HCL 5 MG PO TABS: 5.0000 mg | ORAL_TABLET | ORAL | Status: DC | PRN

## 2012-10-25 MED ORDER — ENOXAPARIN SODIUM 40 MG/0.4ML ~~LOC~~ SOLN: 40.0000 mg | Freq: Every day | SUBCUTANEOUS | Status: DC

## 2012-10-25 MED ORDER — ENOXAPARIN SODIUM 30 MG/0.3ML ~~LOC~~ SOLN: 30.0000 mg | Freq: Two times a day (BID) | SUBCUTANEOUS | Status: DC

## 2012-10-25 MED ORDER — METHOCARBAMOL 500 MG PO TABS: 500.0000 mg | ORAL_TABLET | Freq: Four times a day (QID) | ORAL | Status: DC | PRN

## 2012-10-25 MED ORDER — OXYCODONE HCL ER 10 MG PO T12A: 10.0000 mg | EXTENDED_RELEASE_TABLET | Freq: Two times a day (BID) | ORAL | Status: DC

## 2012-10-25 NOTE — Progress Notes (Signed)
SPORTS MEDICINE AND JOINT REPLACEMENT  Georgena Spurling, MD   Altamese Cabal, PA-C [address] [place], [place], [place]  [address]                             [phone]   PROGRESS NOTE  Subjective:  negative for Chest Pain  negative for Shortness of Breath  negative for Nausea/Vomiting   negative for Calf Pain  negative for Bowel Movement   Tolerating Diet: yes         Patient reports pain as 5 on 0-10 scale.    Objective: Vital signs in last 24 hours:   Patient Vitals for the past 24 hrs:  BP Temp Temp src Pulse Resp SpO2  10/25/12 0555 97/62 mmHg 98.6 F (37 C) - 82  18  100 %  10/25/12 0400 - - - - 18  100 %  10/25/12 0303 95/49 mmHg 98.4 F (36.9 C) Oral 84  16  99 %  10/25/12 0000 - - - - 18  99 %  10/24/12 2340 95/76 mmHg 97.6 F (36.4 C) - 89  16  99 %  10/24/12 2000 - - - - 16  99 %  10/24/12 1200 122/73 mmHg 97.4 F (36.3 C) - 90  14  100 %  10/24/12 1145 120/69 mmHg - - 89  11  100 %  10/24/12 1130 116/67 mmHg - - 87  9  100 %  10/24/12 1115 127/74 mmHg - - 94  16  100 %  10/24/12 1100 130/83 mmHg - - 100  9  100 %    @flow {1959:LAST@   Intake/Output from previous day:   12/23 0701 - 12/24 0700 In: 1595 [P.O.:240; I.V.:1300] Out: 2160 [Urine:1500; Drains:660]   Intake/Output this shift:   12/24 0701 - 12/24 1900 In: 480 [P.O.:480] Out: -    Intake/Output      12/23 0701 - 12/24 0700 12/24 0701 - 12/25 0700   P.O. 240 480   I.V. 1300    IV Piggyback 55    Total Intake 1595 480   Urine 1500    Drains 660    Total Output 2160    Net -565 +480        Urine Occurrence  2 x      LABORATORY DATA:  Basename 10/25/12 0729 10/20/12 1047  WBC 6.6 7.3  HGB 10.2* 14.0  HCT 30.3* 41.6  PLT 189 244    Basename 10/25/12 0729 10/20/12 1047  NA 138 137  K 3.8 4.2  CL 105 100  CO2 25 29  BUN 9 10  CREATININE 0.58 0.67  GLUCOSE 108* 91  CALCIUM 8.3* 9.7   Lab Results  Component Value Date   INR 0.92 10/20/2012    Examination:  General  appearance: alert, cooperative and no distress Extremities: Homans sign is negative, no sign of DVT  Wound Exam: clean, dry, intact   Drainage:  Scant/small amount Serosanguinous exudate  Motor Exam: EHL and FHL Intact  Sensory Exam: Deep Peroneal normal  Vascular Exam:    Assessment:    1 Day Post-Op  Procedure(s) (LRB): TOTAL KNEE ARTHROPLASTY (Right)  ADDITIONAL DIAGNOSIS:  Active Problems:  * No active hospital problems. *   Acute Blood Loss Anemia   Plan: Physical Therapy as ordered Weight Bearing as Tolerated (WBAT)  DVT Prophylaxis:  Lovenox  DISCHARGE PLAN: Home tomorrow  DISCHARGE NEEDS: HHPT, CPM, Walker and 3-in-1 comode seat  Sharron Petruska 10/25/2012, 10:53 AM

## 2012-10-25 NOTE — Evaluation (Signed)
Occupational Therapy Evaluation Patient Details Name: Annette Tran MRN: [id number] DOB: [birth year] Today's Date: 10/25/2012 Time: 1101-1109 OT Time Calculation (min): 8 min  OT Assessment / Plan / Recommendation Clinical Impression  66 yo female s/p Rt TKA that does not require acute OT at this time. OT to sign off    OT Assessment  Patient does not need any further OT services    Follow Up Recommendations  No OT follow up    Barriers to Discharge      Equipment Recommendations  None recommended by OT    Recommendations for Other Services    Frequency       Precautions / Restrictions Precautions Precautions: Knee Restrictions RLE Weight Bearing: Weight bearing as tolerated   Pertinent Vitals/Pain Minimal pain at this time    ADL  Eating/Feeding: Modified independent Where Assessed - Eating/Feeding: Chair Grooming: Wash/dry hands;Wash/dry face;Teeth care;Supervision/safety Where Assessed - Grooming: Unsupported standing Lower Body Dressing: Set up Where Assessed - Lower Body Dressing: Unsupported sit to stand (pt is able to touch toes and verbalized dressing Rt LE first) Toilet Transfer: Supervision/safety Toilet Transfer Method: Sit to Barista: Raised toilet seat with arms (or 3-in-1 over toilet) Equipment Used: Rolling walker;Gait belt Transfers/Ambulation Related to ADLs: pt ambulating observed ambulating with PTA Raynelle Fanning prior to OT evaluation. Pt is supervision level at this time ADL Comments: Pt is able to reach LB for dressing and needs no assistance. pt completed bed mobility with PTA Raynelle Fanning and reports with PTA present no deficits. Pt demonstrates sit<>stand from chair without deficits to simulate bathroom transfer. Pt educated on using 3n1 in tub shower upstairs . pt declines attempting after discussion and plans to sponge bath . Pt will wash hair in kitchen sink. Pt has family and friend (A) for d/c planning. pt plans to stay on the first  floor of the home which only has tub with a tub faucet head. Pt would have to sit in the bottom of the tub to wash up. Pt currenlty with no acute needs and agreeable    OT Diagnosis:    OT Problem List:   OT Treatment Interventions:     OT Goals    Visit Information  Last OT Received On: 10/25/12 Assistance Needed: +1    Subjective Data  Subjective: "i have an old house build in the 1950s"- pt response to describing the house setup Patient Stated Goal: to return home 10/26/12   Prior Functioning     Home Living Lives With: Alone Available Help at Discharge: Friend(s);Available 24 hours/day Type of Home: House Home Access: Stairs to enter [employer] of Steps: 2 Entrance Stairs-Rails: None Home Layout: Two level;Able to live on main level with bedroom/bathroom Bathroom Shower/Tub: Tub/shower unit (Tub only downstairs with no shower faucet) Bathroom Toilet: Standard Bathroom Accessibility: Yes How Accessible: Accessible via walker Home Adaptive Equipment: Walker - rolling;Bedside commode/3-in-1 Prior Function Level of Independence: Independent Able to Take Stairs?: Yes Driving: Yes 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 Dominant Hand: Right         Vision/Perception     Cognition  Overall Cognitive Status: Appears within functional limits for tasks assessed/performed Arousal/Alertness: Awake/alert Orientation Level: Appears intact for tasks assessed Behavior During Session: [facility] for tasks performed    Extremity/Trunk Assessment Right Upper Extremity Assessment RUE ROM/Strength/Tone: [facility] for tasks assessed Left Upper Extremity Assessment LUE ROM/Strength/Tone: [facility] for tasks assessed     Mobility Bed Mobility Bed Mobility: Not  assessed Details for Bed Mobility Assistance: per patient and PTA [name] no deficits at this time Transfers Sit to Stand: 4: Min guard;With upper extremity assist;From  chair/3-in-1 Stand to Sit: 5: Supervision;With upper extremity assist;To chair/3-in-1 Details for Transfer Assistance: Cues for safe hand placement and technique. Cues to slide out R leg           Balance     End of Session OT - End of Session Activity Tolerance: Patient tolerated treatment well Patient left: in chair;with call bell/phone within reach (with PTA julie at end of session) Nurse Communication: Mobility status;Precautions  GO     Lucile Shutters 10/25/2012, 11:19 AM Pager: [phone]

## 2012-10-25 NOTE — Discharge Summary (Signed)
Annette Spurling, MD   Annette Cabal, PA-C [address] [place], [place], [place]  [address]                             [phone]  PATIENT ID: Annette Tran        MRN:  [id number]          DOB/AGE: Sep 01, 1946 / 66 y.o.    DISCHARGE SUMMARY  ADMISSION DATE:    10/24/2012 DISCHARGE DATE:   10/25/2012   ADMISSION DIAGNOSIS: osteoarthrits right knee    DISCHARGE DIAGNOSIS:  osteoarthrits right knee    ADDITIONAL DIAGNOSIS: Active Problems:  * No active hospital problems. *   Past Medical History  Diagnosis Date  . PONV (postoperative nausea and vomiting)   . Arthritis   . Neuromuscular disorder     abd tightness and numbness after c-section    PROCEDURE: Procedure(s): TOTAL KNEE ARTHROPLASTY on 10/24/2012  CONSULTS:     HISTORY:  See H&P in chart  HOSPITAL COURSE:  Annette Tran is a 66 y.o. admitted on 10/24/2012 and found to have a diagnosis of osteoarthrits right knee.  After appropriate laboratory studies were obtained  they were taken to the operating room on 10/24/2012 and underwent Procedure(s): TOTAL KNEE ARTHROPLASTY.   They were given perioperative antibiotics:  Anti-infectives     Start     Dose/Rate Route Frequency Ordered Stop   10/24/12 1500   ceFAZolin (ANCEF) IVPB 2 g/50 mL premix        2 g 100 mL/hr over 30 Minutes Intravenous Every 6 hours 10/24/12 1226 10/24/12 2106   10/23/12 1143   ceFAZolin (ANCEF) IVPB 2 g/50 mL premix        2 g 100 mL/hr over 30 Minutes Intravenous 60 min pre-op 10/23/12 1143 10/24/12 0849        .  Tolerated the procedure well.  Placed with a foley intraoperatively.  Given Ofirmev at induction and for 48 hours.    POD #1, allowed out of bed to a chair.  PT for ambulation and exercise program.  Foley D/C'd in morning.  IV saline locked.  O2 discontionued.  POD #2, continued PT and ambulation.   Hemovac pulled. .  The remainder of the hospital course was dedicated to ambulation and strengthening.   The patient was  discharged on post op[ day 2 in  Good condition.  Blood products given:none  DIAGNOSTIC STUDIES: Recent vital signs: Patient Vitals for the past 24 hrs:  BP Temp Temp src Pulse Resp SpO2  10/25/12 0555 97/62 mmHg 98.6 F (37 C) - 82  18  100 %  10/25/12 0400 - - - - 18  100 %  10/25/12 0303 95/49 mmHg 98.4 F (36.9 C) Oral 84  16  99 %  10/25/12 0000 - - - - 18  99 %  10/24/12 2340 95/76 mmHg 97.6 F (36.4 C) - 89  16  99 %  10/24/12 2000 - - - - 16  99 %  10/24/12 1200 122/73 mmHg 97.4 F (36.3 C) - 90  14  100 %  10/24/12 1145 120/69 mmHg - - 89  11  100 %  10/24/12 1130 116/67 mmHg - - 87  9  100 %  10/24/12 1115 127/74 mmHg - - 94  16  100 %       Recent laboratory studies:  Basename 10/25/12 0729 10/22/2012 1047  WBC 6.6 7.3  HGB  10.2* 14.0  HCT 30.3* 41.6  PLT 189 244    Basename 10/25/12 0729 10/20/12 1047  NA 138 137  K 3.8 4.2  CL 105 100  CO2 25 29  BUN 9 10  CREATININE 0.58 0.67  GLUCOSE 108* 91  CALCIUM 8.3* 9.7   Lab Results  Component Value Date   INR 0.92 10/20/2012     Recent Radiographic Studies :  Dg Chest 2 View  10/20/2012  *RADIOLOGY REPORT*  Clinical Data: Preop for knee replacement  CHEST - 2 VIEW  Comparison: 10/05/2012  Findings: Cardiomediastinal silhouette is unremarkable. No acute infiltrate or pulmonary edema.  Stable chronic blunting of the right costophrenic angle.  IMPRESSION: No active disease.  No significant change.   Original Report Authenticated By: Annette Tran, M.D.     DISCHARGE INSTRUCTIONS: Discharge Orders    Future Orders Please Complete By Expires   Diet - low sodium heart healthy      Call MD / Call 911      Comments:   If you experience chest pain or shortness of breath, CALL 911 and be transported to the hospital emergency room.  If you develope a fever above 101 F, pus (white drainage) or increased drainage or redness at the wound, or calf pain, call your surgeon's office.   Constipation Prevention      Comments:     Drink plenty of fluids.  Prune juice may be helpful.  You may use a stool softener, such as Colace (over the counter) 100 mg twice a day.  Use MiraLax (over the counter) for constipation as needed.   Increase activity slowly as tolerated      Driving restrictions      Comments:   No driving for 6 weeks   Lifting restrictions      Comments:   No lifting for 6 weeks   CPM      Comments:   Continuous passive motion machine (CPM):      Use the CPM from 0 to 90 for 6-8 hours per day.      You may increase by 10 per day.  You may break it up into 2 or 3 sessions per day.      Use CPM for 2 weeks or until you are told to stop.   TED hose      Comments:   Use stockings (TED hose) for 3 weeks on both leg(s).  You may remove them at night for sleeping.   Change dressing      Comments:   Change dressing on wednesday, then change the dressing daily with sterile 4 x 4 inch gauze dressing and apply TED hose.  You may clean the incision with alcohol prior to redressing.   Do not put a pillow under the knee. Place it under the heel.         DISCHARGE MEDICATIONS:     Medication List     As of 10/25/2012 11:03 AM    TAKE these medications         ALEVE 220 MG tablet   Generic drug: naproxen sodium   Take 220 mg by mouth 2 (two) times daily with a meal. For pain      celecoxib 200 MG capsule   Commonly known as: CELEBREX   Take 1 capsule (200 mg total) by mouth every 12 (twelve) hours.      enoxaparin 30 MG/0.3ML injection   Commonly known as: LOVENOX   Inject 0.3 mLs (30  mg total) into the skin every 12 (twelve) hours.      methocarbamol 500 MG tablet   Commonly known as: ROBAXIN   Take 1 tablet (500 mg total) by mouth every 6 (six) hours as needed.      oxyCODONE 5 MG immediate release tablet   Commonly known as: Oxy IR/ROXICODONE   Take 1-2 tablets (5-10 mg total) by mouth every 3 (three) hours as needed.      OxyCODONE 10 mg T12a   Commonly known as: OXYCONTIN   Take 1  tablet (10 mg total) by mouth every 12 (twelve) hours.      Red Yeast Rice 600 MG Caps   Take 2 capsules by mouth daily.        FOLLOW UP VISIT:       Follow-up Information    Follow up with Annette Mutton, MD. Call on 11/08/2012.   Contact information:   Anastasia Fiedler [address] [place] [address] [phone]          DISPOSITION: home 10/26/12  CONDITION:  {Good  Dave Mannes 10/25/2012, 11:03 AM

## 2012-10-25 NOTE — Progress Notes (Signed)
UR COMPLETED  

## 2012-10-25 NOTE — Progress Notes (Signed)
Physical Therapy Treatment Patient Details Name: Annette Tran MRN: [id number] DOB: 1946/07/30 Today's Date: 10/25/2012 Time: 0725-0751 PT Time Calculation (min): 26 min  PT Assessment / Plan / Recommendation Comments on Treatment Session  Patient progressing well with mobilty this morning. Attempt longer ambulation and increased therex later today    Follow Up Recommendations  Home health PT;Supervision/Assistance - 24 hour     Does the patient have the potential to tolerate intense rehabilitation     Barriers to Discharge        Equipment Recommendations  None recommended by PT    Recommendations for Other Services    Frequency 7X/week   Plan Discharge plan remains appropriate;Frequency remains appropriate    Precautions / Restrictions Precautions Precautions: Knee Restrictions RLE Weight Bearing: Weight bearing as tolerated   Pertinent Vitals/Pain     Mobility  Bed Mobility Bed Mobility: Not assessed Transfers Sit to Stand: 4: Min guard;With upper extremity assist;From chair/3-in-1 Stand to Sit: 4: Min guard;With upper extremity assist;To chair/3-in-1 Details for Transfer Assistance: Cues for safe hand placement and technique. Cues to slide out R leg Ambulation/Gait Ambulation/Gait Assistance: 4: Min assist Ambulation Distance (Feet): 6 Feet Assistive device: Rolling walker Ambulation/Gait Assistance Details: Cues for sequence, RW management and posture. A for stability Gait Pattern: Step-to pattern;Decreased step length - right;Decreased step length - left;Trunk flexed    Exercises Total Joint Exercises Ankle Circles/Pumps: AROM;Both;10 reps Quad Sets: AROM;Both;10 reps Heel Slides: AAROM;Right;10 reps   PT Diagnosis:    PT Problem List:   PT Treatment Interventions:     PT Goals Acute Rehab PT Goals PT Goal: Sit to Stand - Progress: Progressing toward goal PT Goal: Ambulate - Progress: Progressing toward goal PT Goal: Perform Home Exercise Program -  Progress: Progressing toward goal  Visit Information  Last PT Received On: 10/25/12 Assistance Needed: +1    Subjective Data      Cognition  Overall Cognitive Status: Appears within functional limits for tasks assessed/performed Arousal/Alertness: Awake/alert Orientation Level: Appears intact for tasks assessed Behavior During Session: [facility] for tasks performed    Balance     End of Session PT - End of Session Equipment Utilized During Treatment: Gait belt Activity Tolerance: Patient tolerated treatment well Patient left: in chair;with call bell/phone within reach Nurse Communication: Mobility status   GP     Fredrich Birks 10/25/2012, 7:56 AM 10/25/2012 Fredrich Birks PTA [phone] pager [phone] office

## 2012-10-25 NOTE — Progress Notes (Signed)
Physical Therapy Treatment Patient Details Name: Annette Tran MRN: [id number] DOB: 1945-12-10 Today's Date: 10/25/2012 Time: 4098-1191 PT Time Calculation (min): 27 min  PT Assessment / Plan / Recommendation Comments on Treatment Session  Patient continues to make really good progress with mobility. Is planning on discharge in AM    Follow Up Recommendations  Home health PT;Supervision/Assistance - 24 hour     Does the patient have the potential to tolerate intense rehabilitation     Barriers to Discharge        Equipment Recommendations  None recommended by PT    Recommendations for Other Services    Frequency 7X/week   Plan Discharge plan remains appropriate;Frequency remains appropriate    Precautions / Restrictions Precautions Precautions: Knee Restrictions RLE Weight Bearing: Weight bearing as tolerated   Pertinent Vitals/Pain     Mobility  Bed Mobility Supine to Sit: 6: Modified independent (Device/Increase time) Sitting - Scoot to Edge of Bed: 6: Modified independent (Device/Increase time) Sit to Supine: 6: Modified independent (Device/Increase time) Details for Bed Mobility Assistance: per patient and PTA [name] no deficits at this time Transfers Sit to Stand: 5: Supervision;With upper extremity assist;From chair/3-in-1;From bed Stand to Sit: 5: Supervision;With upper extremity assist;To bed;To chair/3-in-1 Details for Transfer Assistance: Cues for safe hand placement Ambulation/Gait Ambulation/Gait Assistance: 4: Min guard Ambulation Distance (Feet): 70 Feet Assistive device: Rolling walker Ambulation/Gait Assistance Details: Cues for heel strike on R LE. Good posture and safety with RW Gait Pattern: Step-to pattern;Decreased step length - right;Decreased step length - left Wheelchair Mobility Wheelchair Mobility: No (Patient educated on stair but did not practice)    Exercises Total Joint Exercises Quad Sets: AROM;Both;10 reps Short Arc Quad:  AROM;Right;10 reps Heel Slides: AROM;Right;10 reps Straight Leg Raises: AAROM;Right;10 reps   PT Diagnosis:    PT Problem List:   PT Treatment Interventions:     PT Goals Acute Rehab PT Goals PT Goal: Supine/Side to Sit - Progress: Progressing toward goal PT Goal: Sit to Supine/Side - Progress: Progressing toward goal PT Goal: Sit to Stand - Progress: Met PT Goal: Ambulate - Progress: Progressing toward goal PT Goal: Perform Home Exercise Program - Progress: Progressing toward goal  Visit Information  Last PT Received On: 10/25/12 Assistance Needed: +1    Subjective Data      Cognition  Overall Cognitive Status: Appears within functional limits for tasks assessed/performed Arousal/Alertness: Awake/alert Orientation Level: Appears intact for tasks assessed Behavior During Session: [facility] for tasks performed    Balance     End of Session PT - End of Session Equipment Utilized During Treatment: Gait belt Activity Tolerance: Patient tolerated treatment well Patient left: in chair;with call bell/phone within reach Nurse Communication: Mobility status   GP     Fredrich Birks 10/25/2012, 12:05 PM  10/25/2012 Fredrich Birks PTA [phone] pager [phone] office

## 2012-10-25 NOTE — Progress Notes (Signed)
CARE MANAGEMENT NOTE 10/25/2012  Patient:  Annette Tran, Annette Tran   Account Number:  [id number]  Date Initiated:  10/25/2012  Documentation initiated by:  Vance Peper  Subjective/Objective Assessment:   66 yr old female s/p right total knee arthroplasty.     Action/Plan:   CM spoke with patient concerning home health and DME needs at discharge. Preoperatively setup with [facility], no changes.She has borrowed rolling walker and 3in1, CPM to be delivered to her home. Has family support at discharge.   Anticipated DC Date:  10/26/2012   Anticipated DC Plan:  HOME W HOME HEALTH SERVICES      DC Planning Services  CM consult      [facility] Choice  HOME HEALTH   Choice offered to / List presented to:  C-1 Patient        HH arranged  HH-2 PT      [facility] agency  [facility]   Status of service:  Completed, signed off Medicare Important Message given?   (If response is "NO", the following Medicare IM given date fields will be blank) Date Medicare IM given:   Date Additional Medicare IM given:    Discharge Disposition:  HOME W HOME HEALTH SERVICES  Per UR Regulation:    If discussed at Long Length of Stay Meetings, dates discussed:    Comments:

## 2012-10-26 LAB — CBC
Hemoglobin: 8.8 g/dL — ABNORMAL LOW (ref 12.0–15.0)
MCHC: 34.1 g/dL (ref 30.0–36.0)
RDW: 13 % (ref 11.5–15.5)
WBC: 6.1 10*3/uL (ref 4.0–10.5)

## 2012-10-26 NOTE — Progress Notes (Signed)
Patient ID: Annette Tran, female   DOB: 07/21/1946, 66 y.o.   MRN: [id number]     Subjective:  Patient reports pain as mild to moderate.  She states that she is in some increased pain today and that her bowels have not yet moved.  She is concerned about going home.  Objective:   VITALS:   Filed Vitals:   10/25/12 1450 10/25/12 1726 10/25/12 2000 10/25/12 2345  BP: 95/40 113/63  106/55  Pulse: 87 104  93  Temp: 98.3 F (36.8 C) 98.1 F (36.7 C)  97.9 F (36.6 C)  TempSrc:      Resp: 18 16 18 20   SpO2: 98%  99% 96%    ABD soft Sensation intact distally Dorsiflexion/Plantar flexion intact Incision: dressing C/D/I and no drainage  LABS  Results for orders placed during the hospital encounter of 10/24/12 (from the past 24 hour(s))  CBC     Status: Abnormal   Collection Time   10/26/12  5:25 AM      Component Value Range   WBC 6.1  4.0 - 10.5 K/uL   RBC 2.90 (*) 3.87 - 5.11 MIL/uL   Hemoglobin 8.8 (*) 12.0 - 15.0 g/dL   HCT 40.9 (*) 81.1 - 91.4 %   MCV 89.0  78.0 - 100.0 fL   MCH 30.3  26.0 - 34.0 pg   MCHC 34.1  30.0 - 36.0 g/dL   RDW 78.2  95.6 - 21.3 %   Platelets 133 (*) 150 - 400 K/uL    No results found.  Assessment/Plan: 2 Days Post-Op   ABLA, mild, expected, observe.  Advance diet Up with therapy Continue bowel reg. Will plan for DC home later today or tomorrow   Haskel Khan 10/26/2012, 10:44 AM   Teryl Lucy, MD Cell [phone] Pager [phone]

## 2012-10-26 NOTE — Progress Notes (Signed)
Physical Therapy Treatment Patient Details Name: Annette Tran MRN: [id number] DOB: 01-04-1946 Today's Date: 10/26/2012 Time: 6213-0865 PT Time Calculation (min): 27 min  PT Assessment / Plan / Recommendation Comments on Treatment Session  Pt with increased pain this session, although still moving well. Plan to dc this afternoon, all home education complete    Follow Up Recommendations  Home health PT;Supervision/Assistance - 24 hour     Does the patient have the potential to tolerate intense rehabilitation     Barriers to Discharge        Equipment Recommendations  None recommended by PT    Recommendations for Other Services    Frequency 7X/week   Plan Discharge plan remains appropriate;Frequency remains appropriate    Precautions / Restrictions Precautions Precautions: Knee Restrictions Weight Bearing Restrictions: Yes RLE Weight Bearing: Weight bearing as tolerated   Pertinent Vitals/Pain Pain 5/10. RN aware.    Mobility  Bed Mobility Bed Mobility: Supine to Sit;Sitting - Scoot to Edge of Bed Supine to Sit: 4: Min assist Details for Bed Mobility Assistance: Min assist with RLE out of bed. Educated pt on ways to assist RLE with LLE Transfers Transfers: Sit to Stand;Stand to Sit Sit to Stand: 5: Supervision;With upper extremity assist;From chair/3-in-1;From bed Stand to Sit: 5: Supervision;With upper extremity assist;To bed;To chair/3-in-1 Details for Transfer Assistance: Cues for safe hand placement Ambulation/Gait Ambulation/Gait Assistance: 5: Supervision Ambulation Distance (Feet): 100 Feet Assistive device: Rolling walker Ambulation/Gait Assistance Details: Cues for increased heel strike and knee extension during stance phase on RLE. Good step length Gait Pattern: Step-to pattern;Decreased step length - right;Decreased step length - left;Right flexed knee in stance Stairs: Yes Stairs Assistance: 5: Supervision;4: Min assist Stairs Assistance Details  (indicate cue type and reason): Educated pt and caretaker on backwards RW for 2 steps to enter home as well as proper safety and hand placement for full flight of steps Stair Management Technique: One rail Left;Step to pattern;No rails;Backwards;Forwards;With walker Number of Stairs: 2  (full flight with 1 rail and 1 HHA)    Exercises     PT Diagnosis:    PT Problem List:   PT Treatment Interventions:     PT Goals Acute Rehab PT Goals PT Goal: Supine/Side to Sit - Progress: Progressing toward goal PT Goal: Sit to Supine/Side - Progress: Progressing toward goal PT Goal: Sit to Stand - Progress: Met PT Goal: Ambulate - Progress: Progressing toward goal PT Goal: Up/Down Stairs - Progress: Met  Visit Information  Last PT Received On: 10/26/12 Assistance Needed: +1    Subjective Data      Cognition  Overall Cognitive Status: Appears within functional limits for tasks assessed/performed Arousal/Alertness: Awake/alert Orientation Level: Appears intact for tasks assessed Behavior During Session: [facility] for tasks performed    Balance     End of Session PT - End of Session Equipment Utilized During Treatment: Gait belt Activity Tolerance: Patient tolerated treatment well Patient left: in chair;with call bell/phone within reach Nurse Communication: Mobility status   GP     Annette Tran 10/26/2012, 12:02 PM

## 2012-11-08 ENCOUNTER — Observation Stay (HOSPITAL_COMMUNITY)
Admission: EM | Admit: 2012-11-08 | Discharge: 2012-11-09 | Disposition: A | Discharge status: Home / Self Care | Payer: Medicare Other | Attending: Internal Medicine | Admitting: Internal Medicine

## 2012-11-08 ENCOUNTER — Encounter (HOSPITAL_COMMUNITY): Payer: Self-pay | Admitting: *Deleted

## 2012-11-08 ENCOUNTER — Emergency Department (HOSPITAL_COMMUNITY): Payer: Medicare Other

## 2012-11-08 ENCOUNTER — Other Ambulatory Visit: Payer: Self-pay

## 2012-11-08 DIAGNOSIS — Z96659 Presence of unspecified artificial knee joint: Secondary | ICD-10-CM | POA: Insufficient documentation

## 2012-11-08 DIAGNOSIS — E785 Hyperlipidemia, unspecified: Secondary | ICD-10-CM | POA: Insufficient documentation

## 2012-11-08 DIAGNOSIS — M79609 Pain in unspecified limb: Secondary | ICD-10-CM

## 2012-11-08 DIAGNOSIS — M1990 Unspecified osteoarthritis, unspecified site: Secondary | ICD-10-CM | POA: Diagnosis present

## 2012-11-08 DIAGNOSIS — R079 Chest pain, unspecified: Principal | ICD-10-CM | POA: Diagnosis present

## 2012-11-08 DIAGNOSIS — M7989 Other specified soft tissue disorders: Secondary | ICD-10-CM

## 2012-11-08 DIAGNOSIS — R262 Difficulty in walking, not elsewhere classified: Secondary | ICD-10-CM | POA: Insufficient documentation

## 2012-11-08 DIAGNOSIS — M129 Arthropathy, unspecified: Secondary | ICD-10-CM

## 2012-11-08 DIAGNOSIS — G709 Myoneural disorder, unspecified: Secondary | ICD-10-CM | POA: Diagnosis present

## 2012-11-08 DIAGNOSIS — R112 Nausea with vomiting, unspecified: Secondary | ICD-10-CM

## 2012-11-08 DIAGNOSIS — R0602 Shortness of breath: Secondary | ICD-10-CM | POA: Insufficient documentation

## 2012-11-08 DIAGNOSIS — Z9889 Other specified postprocedural states: Secondary | ICD-10-CM | POA: Diagnosis present

## 2012-11-08 DIAGNOSIS — R05 Cough: Secondary | ICD-10-CM | POA: Insufficient documentation

## 2012-11-08 DIAGNOSIS — R609 Edema, unspecified: Secondary | ICD-10-CM | POA: Insufficient documentation

## 2012-11-08 DIAGNOSIS — R059 Cough, unspecified: Secondary | ICD-10-CM | POA: Insufficient documentation

## 2012-11-08 HISTORY — DX: Chest pain, unspecified: R07.9

## 2012-11-08 LAB — COMPREHENSIVE METABOLIC PANEL
AST: 21 U/L (ref 0–37)
Albumin: 3.6 g/dL (ref 3.5–5.2)
Alkaline Phosphatase: 87 U/L (ref 39–117)
Chloride: 100 mEq/L (ref 96–112)
Potassium: 4.2 mEq/L (ref 3.5–5.1)
Total Bilirubin: 0.2 mg/dL — ABNORMAL LOW (ref 0.3–1.2)

## 2012-11-08 LAB — CBC
HCT: 34.9 % — ABNORMAL LOW (ref 36.0–46.0)
Hemoglobin: 11.5 g/dL — ABNORMAL LOW (ref 12.0–15.0)
MCH: 29.1 pg (ref 26.0–34.0)
MCHC: 33 g/dL (ref 30.0–36.0)
MCV: 88.3 fL (ref 78.0–100.0)
MCV: 88.4 fL (ref 78.0–100.0)
Platelets: 419 10*3/uL — ABNORMAL HIGH (ref 150–400)
RBC: 4.09 MIL/uL (ref 3.87–5.11)
WBC: 9 10*3/uL (ref 4.0–10.5)

## 2012-11-08 LAB — LIPASE, BLOOD: Lipase: 39 U/L (ref 11–59)

## 2012-11-08 MED ORDER — SODIUM CHLORIDE 0.9 % IJ SOLN
3.0000 mL | Freq: Two times a day (BID) | INTRAMUSCULAR | Status: DC
  Administered 2012-11-08 – 2012-11-09 (×2): 3 mL via INTRAVENOUS

## 2012-11-08 MED ORDER — GUAIFENESIN-DM 100-10 MG/5ML PO SYRP: 5.0000 mL | ORAL_SOLUTION | ORAL | Status: DC | PRN

## 2012-11-08 MED ORDER — HYDROCODONE-ACETAMINOPHEN 5-325 MG PO TABS: 1.0000 | ORAL_TABLET | ORAL | Status: DC | PRN

## 2012-11-08 MED ORDER — ENOXAPARIN SODIUM 40 MG/0.4ML ~~LOC~~ SOLN
40.0000 mg | SUBCUTANEOUS | Status: DC
  Administered 2012-11-08: 40 mg via SUBCUTANEOUS
  Filled 2012-11-08 (×2): qty 0.4

## 2012-11-08 MED ORDER — NITROGLYCERIN 0.4 MG SL SUBL
0.4000 mg | SUBLINGUAL_TABLET | SUBLINGUAL | Status: DC | PRN
  Administered 2012-11-08: 0.4 mg via SUBLINGUAL
  Filled 2012-11-08: qty 25

## 2012-11-08 MED ORDER — ONDANSETRON HCL 4 MG PO TABS: 4.0000 mg | ORAL_TABLET | Freq: Four times a day (QID) | ORAL | Status: DC | PRN

## 2012-11-08 MED ORDER — RED YEAST RICE 600 MG PO CAPS: 2.0000 | ORAL_CAPSULE | Freq: Every day | ORAL | Status: DC

## 2012-11-08 MED ORDER — ASPIRIN EC 81 MG PO TBEC
81.0000 mg | DELAYED_RELEASE_TABLET | Freq: Every day | ORAL | Status: DC
  Administered 2012-11-09: 81 mg via ORAL
  Filled 2012-11-08 (×2): qty 1

## 2012-11-08 MED ORDER — ALBUTEROL SULFATE (5 MG/ML) 0.5% IN NEBU: 2.5000 mg | INHALATION_SOLUTION | RESPIRATORY_TRACT | Status: DC | PRN

## 2012-11-08 MED ORDER — OXYCODONE HCL ER 10 MG PO T12A
10.0000 mg | EXTENDED_RELEASE_TABLET | Freq: Two times a day (BID) | ORAL | Status: DC
  Administered 2012-11-08 – 2012-11-09 (×2): 10 mg via ORAL
  Filled 2012-11-08 (×2): qty 1

## 2012-11-08 MED ORDER — ONDANSETRON HCL 4 MG/2ML IJ SOLN: 4.0000 mg | Freq: Four times a day (QID) | INTRAMUSCULAR | Status: DC | PRN

## 2012-11-08 MED ORDER — ALUM & MAG HYDROXIDE-SIMETH 200-200-20 MG/5ML PO SUSP
30.0000 mL | Freq: Three times a day (TID) | ORAL | Status: DC
  Administered 2012-11-08 – 2012-11-09 (×2): 30 mL via ORAL
  Filled 2012-11-08 (×5): qty 30

## 2012-11-08 MED ORDER — ASPIRIN 81 MG PO CHEW
324.0000 mg | CHEWABLE_TABLET | Freq: Once | ORAL | Status: AC
  Administered 2012-11-08: 324 mg via ORAL
  Filled 2012-11-08: qty 4

## 2012-11-08 MED ORDER — METHOCARBAMOL 500 MG PO TABS
500.0000 mg | ORAL_TABLET | Freq: Four times a day (QID) | ORAL | Status: DC | PRN
  Filled 2012-11-08: qty 1

## 2012-11-08 MED ORDER — PANTOPRAZOLE SODIUM 40 MG PO TBEC
40.0000 mg | DELAYED_RELEASE_TABLET | Freq: Every day | ORAL | Status: DC
  Administered 2012-11-08 – 2012-11-09 (×2): 40 mg via ORAL
  Filled 2012-11-08 (×2): qty 1

## 2012-11-08 MED ORDER — IOHEXOL 350 MG/ML SOLN
80.0000 mL | Freq: Once | INTRAVENOUS | Status: AC | PRN
  Administered 2012-11-08: 80 mL via INTRAVENOUS

## 2012-11-08 NOTE — ED Notes (Signed)
Pt had knee surgery 2 weeks ago and went for follow up at dr. Rogue Jury office.  She severe chest pain to mid chest that resolved on it's own.  No pain now

## 2012-11-08 NOTE — ED Notes (Signed)
Pt transported to radiology.

## 2012-11-08 NOTE — ED Notes (Signed)
Ordered food tray

## 2012-11-08 NOTE — ED Notes (Signed)
Pt states she had new central chest pain that felt like tightness with some associated nausea that lasted for aproximately 5 minutes. Pt states now she is having some "indigestion" and discomfort rated 2/10. Also c/o pain in left calf pain upon palpation. +2 LLE edema.

## 2012-11-08 NOTE — H&P (Signed)
[facility]                                                                                    Patient Demographics  Annette Tran, is a 67 y.o. female  CSN: [id number]  MRN: [id number]  DOB - September 12, 1946  Admit Date - 11/08/2012  Outpatient Primary MD for the patient is Default, Provider, MD   With History of -  Past Medical History  Diagnosis Date  . PONV (postoperative nausea and vomiting)   . Arthritis   . Neuromuscular disorder     abd tightness and numbness after c-section      Past Surgical History  Procedure Date  . Cesarean section 1975  . Eye surgery     Laskix  1999  . Abdominal hysterectomy 1997  . 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 Chest Pain     HPI  Annette Tran  is a 67 y.o. female,  with no previous medical problems except dyslipidemia for which she takes over-the-counter herbal supplement, she recently had right total knee replacement done by Dr. Sherlean Foot for 4 weeks ago and finished her for lactic dose Lovenox today. She went for her routine postop followup visit with Dr. Sherlean Foot in his office she experienced sharp substernal pressure-like sensation lasting about 10 minutes, at times it radiated to her back, made her little nauseated, no aggravating factors she says pain went away after she prayed with her friend, patient then presented to the ER where initial workup including EKG, CT of the chest, lower estimate the ultrasound were all unremarkable, first set of troponin was negative, I was called to admit the patient. Of note patient's cardiologist is Dr. Dulce Sellar in [facility] phone number [phone], she has no previous history of CAD, no family history of CAD at early age. Nonsmoker, no history of hypertension, diabetes  or obesity.     Review of Systems    In addition to the HPI above,   No Fever-chills, No Headache, No changes with Vision or hearing, No problems swallowing food or Liquids, No Chest pain, Cough or Shortness of Breath, No Abdominal pain, No Nausea or Vommitting, Bowel movements are regular, No Blood in stool or Urine, No dysuria, No new skin rashes or bruises, No new joints pains-aches,  No new weakness, tingling, numbness in any extremity, No recent weight gain or loss, No polyuria, polydypsia or polyphagia, No significant Mental Stressors.  A full 10 point Review of Systems was done, except as stated above, all other Review of Systems were negative.   Social History History  Substance Use Topics  . Smoking status: Former Smoker    Types: Cigarettes  . Smokeless tobacco: Never Used  . Alcohol Use: No      Family History   Prior to Admission medications   Medication Sig Start Date End Date Taking? Authorizing Provider  celecoxib (CELEBREX) 200 MG capsule Take 1 capsule (200 mg total)  by mouth every 12 (twelve) hours. 10/25/12  Yes Altamese Cabal, PA  enoxaparin (LOVENOX) 40 MG/0.4ML injection Inject 0.4 mLs (40 mg total) into the skin daily. 10/25/12  Yes Altamese Cabal, PA  methocarbamol (ROBAXIN) 500 MG tablet Take 1 tablet (500 mg total) by mouth every 6 (six) hours as needed. 10/25/12  Yes Altamese Cabal, PA  naproxen sodium (ALEVE) 220 MG tablet Take 220 mg by mouth 2 (two) times daily with a meal. For pain   Yes Historical Provider, MD  oxyCODONE (OXY IR/ROXICODONE) 5 MG immediate release tablet Take 1-2 tablets (5-10 mg total) by mouth every 3 (three) hours as needed. 10/25/12  Yes Altamese Cabal, PA  OxyCODONE (OXYCONTIN) 10 mg T12A Take 1 tablet (10 mg total) by mouth every 12 (twelve) hours. 10/25/12  Yes Altamese Cabal, PA  Red Yeast Rice 600 MG CAPS Take 2 capsules by mouth daily.   Yes Historical Provider, MD    Allergies  Allergen Reactions  . Demerol  (Meperidine)     Hallucinations  . Other Nausea And Vomiting    Avocado, scallops   . Doxycycline Palpitations    Physical Exam  Vitals  Blood pressure 113/70, pulse 86, temperature 97.4 F (36.3 C), temperature source Oral, resp. rate 20, SpO2 98.00%.   1. General middle-aged Caucasian femalel lying in bed in NAD,   2. Normal affect and insight, Not Suicidal or Homicidal, Awake Alert, Oriented X 3.  3. No F.N deficits, ALL C.Nerves Intact, Strength 5/5 all 4 extremities, Sensation intact all 4 extremities, Plantars down going.  4. Ears and Eyes appear Normal, Conjunctivae clear, PERRLA. Moist Oral Mucosa.  5. Supple Neck, No JVD, No cervical lymphadenopathy appriciated, No Carotid Bruits.  6. Symmetrical Chest wall movement, Good air movement bilaterally, CTAB.  7. RRR, No Gallops, Rubs or Murmurs, No Parasternal Heave.  8. Positive Bowel Sounds, Abdomen Soft, Non tender, No organomegaly appriciated,No rebound -guarding or rigidity.  9.  No Cyanosis, Normal Skin Turgor, No Skin Rash or Bruise.  10. Good muscle tone,  joints appear normal , no effusions, Normal ROM. Postoperative site on the right knee is healing nicely and is stable  11. No Palpable Lymph Nodes in Neck or Axillae     Data Review  CBC  Lab 11/08/12 1504  WBC 9.0  HGB 12.2  HCT 36.1  PLT 419*  MCV 88.3  MCH 29.8  MCHC 33.8  RDW 13.0  LYMPHSABS --  MONOABS --  EOSABS --  BASOSABS --  BANDABS --   ------------------------------------------------------------------------------------------------------------------  Chemistries   Lab 11/08/12 1505  NA 139  K 4.2  CL 100  CO2 28  GLUCOSE 145*  BUN 19  CREATININE 0.79  CALCIUM 9.9  MG --  AST 21  ALT 17  ALKPHOS 87  BILITOT 0.2*   ------------------------------------------------------------------------------------------------------------------ CrCl is unknown because both a height and weight (above a minimum accepted value) are  required for this calculation. ------------------------------------------------------------------------------------------------------------------ No results found for this basename: TSH,T4TOTAL,FREET3,T3FREE,THYROIDAB in the last 72 hours   Coagulation profile No results found for this basename: INR:5,PROTIME:5 in the last 168 hours ------------------------------------------------------------------------------------------------------------------- No results found for this basename: DDIMER:2 in the last 72 hours -------------------------------------------------------------------------------------------------------------------  Cardiac Enzymes No results found for this basename: CK:3,CKMB:3,TROPONINI:3,MYOGLOBIN:3 in the last 168 hours ------------------------------------------------------------------------------------------------------------------ No components found with this basename: POCBNP:3   ---------------------------------------------------------------------------------------------------------------  Urinalysis    Component Value Date/Time   COLORURINE YELLOW 10/20/2012 1047   APPEARANCEUR CLEAR 10/20/2012 1047   LABSPEC 1.006 10/20/2012 1047  PHURINE 7.5 10/20/2012 1047   GLUCOSEU NEGATIVE 10/20/2012 1047   HGBUR NEGATIVE 10/20/2012 1047   BILIRUBINUR NEGATIVE 10/20/2012 1047   KETONESUR NEGATIVE 10/20/2012 1047   PROTEINUR NEGATIVE 10/20/2012 1047   UROBILINOGEN 0.2 10/20/2012 1047   NITRITE NEGATIVE 10/20/2012 1047   LEUKOCYTESUR NEGATIVE 10/20/2012 1047    ----------------------------------------------------------------------------------------------------------------  Imaging results:   Dg Chest 2 View  11/08/2012  *RADIOLOGY REPORT*  Clinical Data: 67 year old female with chest pain and nausea.  CHEST - 2 VIEW  Comparison: 10/20/2012 and 03/05/2009  Findings: The cardiomediastinal silhouette is unremarkable. The lungs are clear. There is no evidence of focal  airspace disease, pulmonary edema, suspicious pulmonary nodule/mass, pleural effusion, or pneumothorax. No acute bony abnormalities are identified.  IMPRESSION: No evidence of active cardiopulmonary disease.   Original Report Authenticated By: Harmon Pier, M.D.    Dg Chest 2 View  10/20/2012  *RADIOLOGY REPORT*  Clinical Data: Preop for knee replacement  CHEST - 2 VIEW  Comparison: 10/05/2012  Findings: Cardiomediastinal silhouette is unremarkable. No acute infiltrate or pulmonary edema.  Stable chronic blunting of the right costophrenic angle.  IMPRESSION: No active disease.  No significant change.   Original Report Authenticated By: Natasha Mead, M.D.    Ct Angio Chest Pe W/cm &/or Wo Cm  11/08/2012  *RADIOLOGY REPORT*  Clinical Data: Chest pain.  Knee replacement on 10/24/2012  CT ANGIOGRAPHY CHEST  Technique:  Multidetector CT imaging of the chest using the standard protocol during bolus administration of intravenous contrast. Multiplanar reconstructed images including MIPs were obtained and reviewed to evaluate the vascular anatomy.  Contrast:  80 ml Omnipaque 350  Comparison: Chest x-ray earlier today.  Findings: The lung fields are clear.  Other than minimal dependent atelectasis, there is no infiltrate or effusion.  No pulmonary nodules are seen.  There is good opacification of the pulmonary vasculature.  There is no evidence for pulmonary emboli.  The heart is normal in size.  There is no coronary atherosclerosis. The aorta shows a tiny bit of calcium in the transverse arch.  The central airways are patent.  No bronchial lesions are seen.  There is no axillary, supraclavicular, hilar or mediastinal adenopathy.  Negative osseous structures.  Upper abdominal organs unremarkable.  IMPRESSION: Negative CTA chest. No evidence for pulmonary embolus or postoperative pneumonia.   Original Report Authenticated By: Davonna Belling, M.D.     My personal review of EKG: Rhythm NSR  no Acute ST changes    Assessment  & Plan   1. Atypical substernal chest pain in a patient with recent right total knee replacement.- Ultrasound leg and CT chest negative for PE of any acute finding, chest pain was atypical, patient does not have any risk factors for CAD except dyslipidemia for which she takes over-the-counter herbal supplement, she is currently chest pain-free, she will be kept 23 are also telemetry bed, will cycle her troponins, obtain echo gram to rule out any wall motion abnormalities, if she is pain free troponins are negative her case can be discussed with her cardiologist Dr. Dulce Sellar phone number [phone], and if okay by him discharged home for outpatient followup with him.   At this time pain could be reflux related, will place her on PPI and Maalox.    2. Recent right total knee replacement- she has followed up postoperative LC, will keep her activity as tolerated, Lovenox 40 subcutaneous while she is in the hospital. She has finished her prophylactic dose Lovenox course today.    DVT Prophylaxis  Lovenox    AM Labs Ordered, also please review Full Orders  Family Communication: Admission, patients condition and plan of care including tests being ordered have been discussed with the patient and friend who indicate understanding and agree with the plan and Code Status.  Code Status full  Disposition Plan: home  Time spent in minutes : 35  Condition Marinell Blight K M.D on 11/08/2012 at 6:52 PM  Between 7am to 7pm - Pager - [phone]  After 7pm go to [email or web address] - password [email or web address]  And look for the night coverage person covering me after hours  [facility] Office  [phone]

## 2012-11-08 NOTE — ED Notes (Signed)
Pt transported to vascular study  

## 2012-11-08 NOTE — ED Notes (Signed)
Called report to 3W.  

## 2012-11-08 NOTE — ED Notes (Signed)
Pt's sonDonivan Tran [phone]). Lives in [place]

## 2012-11-08 NOTE — ED Provider Notes (Signed)
History     CSN: [id number]  Arrival date & time 11/08/12  1413   First MD Initiated Contact with Patient 11/08/12 1440      Chief Complaint  Patient presents with  . Chest Pain    (Consider location/radiation/quality/duration/timing/severity/associated sxs/prior treatment) HPI   Patient with substernal chest pain today at orthopedist's office.  Symptoms pressure and pain lasted about 8 minutes.  Slight dyspnea, has noted hr increased.  Mild cough for several days, but no fever.  No nausea, in fact, patient was hungry and given sandwich by office staff after event.  She has not had any pain with po intake or vomiting.  Patient was on lovenox until yesterday.  She has been ambulatory with a walker.  She has noted some lle edema.  She still has some indigestion which she rates at a 2 throughout her chest. She was sent by private vehicle and was not given any meds prior to my evaluation.  She took an oxycodone about 11 a.m.   Past Medical History  Diagnosis Date  . PONV (postoperative nausea and vomiting)   . Arthritis   . Neuromuscular disorder     abd tightness and numbness after c-section    Past Surgical History  Procedure Date  . Cesarean section 1975  . Eye surgery     Laskix  1999  . Abdominal hysterectomy 1997  . 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  History  Substance Use Topics  . Smoking status: Former Smoker    Types: Cigarettes  . Smokeless tobacco: Never Used  . Alcohol Use: No    OB History    Grav Para Term Preterm Abortions TAB SAB Ect Mult Living                  Review of Systems  Constitutional: Negative for fever, chills, activity change, appetite change and unexpected weight change.  HENT: Negative for sore throat, rhinorrhea, neck pain, neck stiffness and sinus  pressure.   Eyes: Negative for visual disturbance.  Respiratory: Positive for cough and shortness of breath.   Cardiovascular: Positive for leg swelling. Negative for chest pain.  Gastrointestinal: Negative for vomiting, abdominal pain, diarrhea and blood in stool.  Genitourinary: Negative for dysuria, urgency, frequency, vaginal discharge and difficulty urinating.  Musculoskeletal: Negative for myalgias, arthralgias and gait problem.  Skin: Negative for color change and rash.  Neurological: Negative for weakness, light-headedness and headaches.  Hematological: Does not bruise/bleed easily.  Psychiatric/Behavioral: Negative for dysphoric mood.    Allergies  Demerol and Doxycycline  Home Medications   Current Outpatient Rx  Name  Route  Sig  Dispense  Refill  . CELECOXIB 200 MG PO CAPS   Oral   Take 1 capsule (200 mg total) by mouth every 12 (twelve) hours.   30 capsule   0   . ENOXAPARIN SODIUM 40 MG/0.4ML Sarah Ann SOLN   Subcutaneous   Inject 0.4 mLs (40 mg total) into the skin daily.   12 Syringe   0   . METHOCARBAMOL 500 MG PO TABS   Oral   Take 1 tablet (500 mg total) by mouth every 6 (six) hours as needed.   60 tablet   0   . NAPROXEN SODIUM 220 MG PO TABS   Oral   Take 220 mg  by mouth 2 (two) times daily with a meal. For pain         . OXYCODONE HCL 5 MG PO TABS   Oral   Take 1-2 tablets (5-10 mg total) by mouth every 3 (three) hours as needed.   90 tablet   0   . OXYCODONE HCL ER 10 MG PO T12A   Oral   Take 1 tablet (10 mg total) by mouth every 12 (twelve) hours.   30 tablet   0   . RED YEAST RICE 600 MG PO CAPS   Oral   Take 2 capsules by mouth daily.           BP 113/71  Pulse 85  Temp 97.4 F (36.3 C) (Oral)  Resp 18  SpO2 97%  Physical Exam  Nursing note and vitals reviewed. Constitutional: She appears well-developed and well-nourished.  HENT:  Head: Normocephalic and atraumatic.  Eyes: Conjunctivae normal and EOM are normal. Pupils are  equal, round, and reactive to light.  Neck: Normal range of motion. Neck supple.  Cardiovascular: Normal rate, regular rhythm, normal heart sounds and intact distal pulses.   Pulmonary/Chest: Effort normal and breath sounds normal.  Abdominal: Soft. Bowel sounds are normal. There is tenderness.       Mild epigastric tenderness  Musculoskeletal: Normal range of motion.       Well healing midline rle anterior incision,  lle with some tenderness along the medial superior calf and lower thigh  Neurological: She is alert.  Skin: Skin is warm and dry.  Psychiatric: She has a normal mood and affect. Thought content normal.    ED Course  Procedures (including critical care time)   Labs Reviewed  CBC  BASIC METABOLIC PANEL   No results found.   No diagnosis found.   Date: 11/08/2012  Rate: 92  Rhythm: normal sinus rhythm  QRS Axis: normal  Intervals: normal  ST/T Wave abnormalities: normal  Conduction Disutrbances: none  Narrative Interpretation: unremarkable      MDM  Chest pain, plan admission for further evaluation  Discussed with hospitalist and he will assess.       Hilario Quarry, MD 11/08/12 [phone]

## 2012-11-08 NOTE — Progress Notes (Signed)
*  Preliminary Results* Left lower extremity venous duplex completed. Left lower extremity is negative for deep vein thrombosis. No evidence of left Baker's cyst.  11/08/2012 3:49 PM Gertie Fey, RDMS, RDCS

## 2012-11-09 DIAGNOSIS — M79609 Pain in unspecified limb: Secondary | ICD-10-CM

## 2012-11-09 DIAGNOSIS — R072 Precordial pain: Secondary | ICD-10-CM

## 2012-11-09 LAB — CBC
HCT: 34.3 % — ABNORMAL LOW (ref 36.0–46.0)
Hemoglobin: 11.4 g/dL — ABNORMAL LOW (ref 12.0–15.0)
MCV: 88.2 fL (ref 78.0–100.0)
RBC: 3.89 MIL/uL (ref 3.87–5.11)
RDW: 13.1 % (ref 11.5–15.5)
WBC: 7.6 10*3/uL (ref 4.0–10.5)

## 2012-11-09 LAB — BASIC METABOLIC PANEL
CO2: 29 mEq/L (ref 19–32)
Chloride: 100 mEq/L (ref 96–112)
Creatinine, Ser: 0.73 mg/dL (ref 0.50–1.10)

## 2012-11-09 LAB — TROPONIN I: Troponin I: <0.3 ng/mL (ref <0.30)

## 2012-11-09 MED ORDER — DOCUSATE SODIUM 100 MG PO CAPS
100.0000 mg | ORAL_CAPSULE | Freq: Two times a day (BID) | ORAL | Status: DC
  Administered 2012-11-09: 100 mg via ORAL
  Filled 2012-11-09 (×2): qty 1

## 2012-11-09 NOTE — Progress Notes (Signed)
Pt discharged to home per MD order. Pt received and reviewed all discharge instructions and medication information including follow-up appointments and prescriptions. Pt verbalized understanding.  Pt alert and oriented at discharge with no complaints. Pt escorted to private vehicle via wheelchair by nurse tech. Annette Tran

## 2012-11-09 NOTE — Progress Notes (Signed)
VASCULAR LAB PRELIMINARY  PRELIMINARY  PRELIMINARY  PRELIMINARY  Right lower extremity venous duplex completed.    Preliminary report:  Right:  No evidence of DVT, superficial thrombosis, or Baker's cyst.  Kesa Birky, RVT 11/09/2012, 3:45 PM

## 2012-11-09 NOTE — Progress Notes (Signed)
UR Completed Kennya Schwenn Graves-Bigelow, RN,BSN [phone]  

## 2012-11-09 NOTE — Progress Notes (Signed)
*  PRELIMINARY RESULTS* Echocardiogram 2D Echocardiogram has been performed.  Ellender Hose A 11/09/2012, 9:48 AM

## 2012-11-09 NOTE — Discharge Summary (Signed)
Physician Discharge Summary  Annette Tran ZOX:[id number] DOB: Jan 30, 1946 DOA: 11/08/2012  PCP: Default, Provider, MD  Admit date: 11/08/2012 Discharge date: 11/09/2012  Time spent: 35 minutes  Recommendations for Outpatient Follow-up:  1. Outpatient PT  Discharge Diagnoses:  Principal Problem:  *Chest pain Active Problems:  PONV (postoperative nausea and vomiting)  Arthritis  Neuromuscular disorder   Discharge Condition: improved  Diet recommendation: heart healthy  Filed Weights   11/09/12 0500  Weight: 59.966 kg (132 lb 3.2 oz)    History of present illness:  Annette Tran is a 67 y.o. female, with no previous medical problems except dyslipidemia for which she takes over-the-counter herbal supplement, she recently had right total knee replacement done by Dr. Sherlean Foot for 4 weeks ago and finished her for lactic dose Lovenox today. She went for her routine postop followup visit with Dr. Sherlean Foot in his office she experienced sharp substernal pressure-like sensation lasting about 10 minutes, at times it radiated to her back, made her little nauseated, no aggravating factors she says pain went away after she prayed with her friend, patient then presented to the ER where initial workup including EKG, CT of the chest, lower estimate the ultrasound were all unremarkable, first set of troponin was negative, I was called to admit the patient. Of note patient's cardiologist is Dr. Dulce Sellar in [facility] phone number [phone], she has no previous history of CAD, no family history of CAD at early age. Nonsmoker, no history of hypertension, diabetes or obesity.   Hospital Course:  Chest pain- b/l duplexes negative, CT scan negative, CE negative, tele NSR- follow up with PCP and cardiology Started after she got out of her car and breathed in the very cold air before her appointment   Procedures:  Echo Study Conclusions  Left ventricle: The cavity size was normal. Wall  thickness was normal. Systolic function was normal. The estimated ejection fraction was in the range of 60% to 65%.    Consultations:  none  Discharge Exam: Filed Vitals:   11/08/12 2015 11/08/12 2045 11/09/12 0500 11/09/12 1400  BP: 99/57 108/56 103/61 104/59  Pulse: 86 85 77 85  Temp:  98.2 F (36.8 C) 97.5 F (36.4 C) 98 F (36.7 C)  TempSrc:  Oral Oral Oral  Resp: 15 16 18 18   Height:  5\' 4"  (1.626 m)    Weight:   59.966 kg (132 lb 3.2 oz)   SpO2: 98% 97% 100% 100%    General: A+Ox3,NAD Cardiovascular: rrr Respiratory: clear anterior  Discharge Instructions      Discharge Orders    Future Orders Please Complete By Expires   Diet - low sodium heart healthy      Increase activity slowly      Discharge instructions      Comments:   Resume outpatient PT       Medication List     As of 11/09/2012  3:54 PM    STOP taking these medications         enoxaparin 40 MG/0.4ML injection   Commonly known as: LOVENOX      TAKE these medications         ALEVE 220 MG tablet   Generic drug: naproxen sodium   Take 220 mg by mouth 2 (two) times daily with a meal. For pain      celecoxib 200 MG capsule   Commonly known as: CELEBREX   Take 1 capsule (200 mg total) by mouth every 12 (twelve) hours.  methocarbamol 500 MG tablet   Commonly known as: ROBAXIN   Take 1 tablet (500 mg total) by mouth every 6 (six) hours as needed.      oxyCODONE 5 MG immediate release tablet   Commonly known as: Oxy IR/ROXICODONE   Take 1-2 tablets (5-10 mg total) by mouth every 3 (three) hours as needed.      OxyCODONE 10 mg T12a   Commonly known as: OXYCONTIN   Take 1 tablet (10 mg total) by mouth every 12 (twelve) hours.      Red Yeast Rice 600 MG Caps   Take 2 capsules by mouth daily.           The results of significant diagnostics from this hospitalization (including imaging, microbiology, ancillary and laboratory) are listed below for reference.    Significant  Diagnostic Studies: Dg Chest 2 View  11/08/2012  *RADIOLOGY REPORT*  Clinical Data: 67 year old female with chest pain and nausea.  CHEST - 2 VIEW  Comparison: 10/20/2012 and 03/05/2009  Findings: The cardiomediastinal silhouette is unremarkable. The lungs are clear. There is no evidence of focal airspace disease, pulmonary edema, suspicious pulmonary nodule/mass, pleural effusion, or pneumothorax. No acute bony abnormalities are identified.  IMPRESSION: No evidence of active cardiopulmonary disease.   Original Report Authenticated By: Harmon Pier, M.D.    Dg Chest 2 View  10/20/2012  *RADIOLOGY REPORT*  Clinical Data: Preop for knee replacement  CHEST - 2 VIEW  Comparison: 10/05/2012  Findings: Cardiomediastinal silhouette is unremarkable. No acute infiltrate or pulmonary edema.  Stable chronic blunting of the right costophrenic angle.  IMPRESSION: No active disease.  No significant change.   Original Report Authenticated By: Natasha Mead, M.D.    Ct Angio Chest Pe W/cm &/or Wo Cm  11/08/2012  *RADIOLOGY REPORT*  Clinical Data: Chest pain.  Knee replacement on 10/24/2012  CT ANGIOGRAPHY CHEST  Technique:  Multidetector CT imaging of the chest using the standard protocol during bolus administration of intravenous contrast. Multiplanar reconstructed images including MIPs were obtained and reviewed to evaluate the vascular anatomy.  Contrast:  80 ml Omnipaque 350  Comparison: Chest x-ray earlier today.  Findings: The lung fields are clear.  Other than minimal dependent atelectasis, there is no infiltrate or effusion.  No pulmonary nodules are seen.  There is good opacification of the pulmonary vasculature.  There is no evidence for pulmonary emboli.  The heart is normal in size.  There is no coronary atherosclerosis. The aorta shows a tiny bit of calcium in the transverse arch.  The central airways are patent.  No bronchial lesions are seen.  There is no axillary, supraclavicular, hilar or mediastinal adenopathy.   Negative osseous structures.  Upper abdominal organs unremarkable.  IMPRESSION: Negative CTA chest. No evidence for pulmonary embolus or postoperative pneumonia.   Original Report Authenticated By: Davonna Belling, M.D.     Microbiology: No results found for this or any previous visit (from the past 240 hour(s)).   Labs: Basic Metabolic Panel:  Lab 11/09/12 1610 11/08/12 2055 11/08/12 1505  NA 139 -- 139  K 4.5 -- 4.2  CL 100 -- 100  CO2 29 -- 28  GLUCOSE 91 -- 145*  BUN 20 -- 19  CREATININE 0.73 0.79 0.79  CALCIUM 9.8 -- 9.9  MG -- -- --  PHOS -- -- --   Liver Function Tests:  Lab 11/08/12 1505  AST 21  ALT 17  ALKPHOS 87  BILITOT 0.2*  PROT 7.0  ALBUMIN 3.6  Lab 11/08/12 1505  LIPASE 39  AMYLASE --   No results found for this basename: AMMONIA:5 in the last 168 hours CBC:  Lab 11/09/12 0300 11/08/12 2055 11/08/12 1504  WBC 7.6 8.5 9.0  NEUTROABS -- -- --  HGB 11.4* 11.5* 12.2  HCT 34.3* 34.9* 36.1  MCV 88.2 88.4 88.3  PLT 385 409* 419*   Cardiac Enzymes:  Lab 11/09/12 0852 11/09/12 0300 11/08/12 2055 11/08/12 1853  CKTOTAL -- -- -- --  CKMB -- -- -- --  CKMBINDEX -- -- -- --  TROPONINI <0.30 <0.30 <0.30 <0.30   BNP: BNP (last 3 results) No results found for this basename: PROBNP:3 in the last 8760 hours CBG: No results found for this basename: GLUCAP:5 in the last 168 hours     Signed:  Marlin Canary  [facility] 11/09/2012, 3:54 PM

## 2012-11-24 DIAGNOSIS — Z96659 Presence of unspecified artificial knee joint: Secondary | ICD-10-CM

## 2012-11-24 HISTORY — DX: Presence of unspecified artificial knee joint: Z96.659

## 2013-07-27 ENCOUNTER — Other Ambulatory Visit: Payer: Self-pay | Admitting: Orthopedic Surgery

## 2013-08-02 ENCOUNTER — Encounter (HOSPITAL_COMMUNITY): Payer: Self-pay | Admitting: Pharmacy Technician

## 2013-08-03 DIAGNOSIS — M1712 Unilateral primary osteoarthritis, left knee: Secondary | ICD-10-CM

## 2013-08-03 HISTORY — DX: Unilateral primary osteoarthritis, left knee: M17.12

## 2013-08-04 ENCOUNTER — Encounter (HOSPITAL_COMMUNITY)
Admission: RE | Admit: 2013-08-04 | Discharge: 2013-08-04 | Disposition: A | Discharge status: Home / Self Care | Payer: Medicare Other | Source: Ambulatory Visit | Attending: Orthopedic Surgery | Admitting: Orthopedic Surgery

## 2013-08-04 ENCOUNTER — Encounter (HOSPITAL_COMMUNITY): Payer: Self-pay

## 2013-08-04 DIAGNOSIS — Z01812 Encounter for preprocedural laboratory examination: Secondary | ICD-10-CM | POA: Insufficient documentation

## 2013-08-04 DIAGNOSIS — Z01818 Encounter for other preprocedural examination: Secondary | ICD-10-CM | POA: Insufficient documentation

## 2013-08-04 HISTORY — DX: Shortness of breath: R06.02

## 2013-08-04 LAB — URINALYSIS, ROUTINE W REFLEX MICROSCOPIC
Glucose, UA: NEGATIVE mg/dL
Hgb urine dipstick: NEGATIVE
Leukocytes, UA: NEGATIVE
Specific Gravity, Urine: 1.016 (ref 1.005–1.030)
pH: 5 (ref 5.0–8.0)

## 2013-08-04 LAB — COMPREHENSIVE METABOLIC PANEL
ALT: 16 U/L (ref 0–35)
AST: 20 U/L (ref 0–37)
Albumin: 4 g/dL (ref 3.5–5.2)
Alkaline Phosphatase: 98 U/L (ref 39–117)
Calcium: 9.7 mg/dL (ref 8.4–10.5)
Chloride: 100 mEq/L (ref 96–112)
GFR calc Af Amer: >90 mL/min (ref >90)
Glucose, Bld: 98 mg/dL (ref 70–99)
Potassium: 4.4 mEq/L (ref 3.5–5.1)
Sodium: 137 mEq/L (ref 135–145)
Total Bilirubin: 0.3 mg/dL (ref 0.3–1.2)
Total Protein: 7.8 g/dL (ref 6.0–8.3)

## 2013-08-04 LAB — SURGICAL PCR SCREEN
MRSA, PCR: NEGATIVE
Staphylococcus aureus: NEGATIVE

## 2013-08-04 LAB — CBC WITH DIFFERENTIAL/PLATELET
Basophils Absolute: 0.1 10*3/uL (ref 0.0–0.1)
Eosinophils Absolute: 0.1 10*3/uL (ref 0.0–0.7)
Lymphs Abs: 2.3 10*3/uL (ref 0.7–4.0)
MCH: 30.2 pg (ref 26.0–34.0)
MCV: 88.2 fL (ref 78.0–100.0)
Neutrophils Relative %: 77 % (ref 43–77)
Platelets: 286 10*3/uL (ref 150–400)
RBC: 4.93 MIL/uL (ref 3.87–5.11)
RDW: 13.5 % (ref 11.5–15.5)
WBC: 14.5 10*3/uL — ABNORMAL HIGH (ref 4.0–10.5)

## 2013-08-04 LAB — TYPE AND SCREEN: Antibody Screen: NEGATIVE

## 2013-08-04 NOTE — Progress Notes (Addendum)
Chest congestion started the beginning of Sept.--coughing up greenish thick yellow sputum--went to PCP--Dr Tomasa Blase in [place], was given a course of antibiotics and steroids....which cleared up greenish sputum, then was given kenalog injection for coughing and sob.   She went to [facility] for CXR (clear) & Pulmonary studies (clear)....she is still having persisent hoarseness and coughing....more so in the AM...now the PCP thinks it may be chronic bronchitis.  She is now coughing up pale yellow secretions and always clearing throat.  I have requested records from [place].     DA Spoke with Dr. Sampson Goon, case explained and he has deferred seeing her.  Contacts: MARIAH MARTIN - DAUGHTER   [phone] DANIEL  AHO  --  SON   [phone] Patient states she will bring her teds hose with her on the DOS.(she had other knee operated on earlier).

## 2013-08-04 NOTE — Pre-Procedure Instructions (Signed)
Annette Tran  08/04/2013   Your procedure is scheduled on:  Monday, October 13th   Report to [facility]  2 * 3 at  5:30 AM.  Call this number if you have problems the morning of surgery: [phone]   Remember:   Do not eat food or drink liquids after midnight Sunday.   Take these medicines the morning of surgery--Nebulizer treatment   Do not wear jewelry, make-up or nail polish.  Do not wear lotions, powders, or perfumes. You may NOT wear deodorant.  Do not shave underarms & legs 48 hours prior to surgery.    Do not bring valuables to the hospital.  [facility] is not responsible for any belongings or valuables.               Contacts, dentures or bridgework may not be worn into surgery.  Leave suitcase in the car. After surgery it may be brought to your room.   For patients admitted to the hospital, discharge time is determined by your treatment team.               Name and phone number of your driver:    Special Instructions: Shower using CHG 2 nights before surgery and the night before surgery.  If you shower the day of surgery use CHG.  Use special wash - you have one bottle of CHG for all showers.  You should use approximately 1/3 of the bottle for each shower.   Please read over the following fact sheets that you were given: Pain Booklet, Coughing and Deep Breathing, Blood Transfusion Information, MRSA Information and Surgical Site Infection Prevention

## 2013-08-05 LAB — URINE CULTURE
Colony Count: NO GROWTH
Culture: NO GROWTH

## 2013-08-08 NOTE — Progress Notes (Addendum)
Anesthesia Chart Review:  Patient is a 67 year old female scheduled for left TKA on 08/14/13 by Dr. Sherlean Tran.  History includes right TKA 10/24/12, non-smoker, hysterectomy, post-operative N/V.  PCP is Dr. Foye Tran.  She has been treated for "allergies" this fall.  She actually canceled a trip to [place] due to their severity.  She completed a steroid taper about a week ago.  She was referred to allergist Dr. Lucie Tran in [place] whom she saw just yesterday.  Reportedly, she was told that he recommended that she have spinal anesthesia instead of GA. (Dr. Sherlean Tran is aware and is okay with that if it is okay with the anesthesiologist.)  Notes are pending, but I believe due to reactive airway/inflammation from her ongoing issues.  She reports he also believes reflux is a contributing factor.  She was already on Singular and albuterol PRN, but he has added QVAR PRN, ranitidine 300 mg Q PM and omeprazole 20 mg BID. (I did instruct her that she could take the Singular, omeprazole, and QVAR as well on the day of surgery.  She will bring them in so they can be added to her medication list.)  EKG on 11/08/12 showed NSR, RAE.  Echo on 11/09/12 showed: Left ventricle: The cavity size was normal. Wall thickness was normal. Systolic function was normal. The estimated ejection fraction was in the range of 60% to 65%. Trivial MR.  No AR/AS/TR/PR.  She reports an unremarkable CXR and normal PFT's within the last two months at her PCP office.  Will request records.   Preoperative labs noted. WBC 14.5K.  Urine culture showed no growth.  She reports that she is feeling overall better, but continues to have intermittent coughing and hoarseness.  She has not had any fevers.  She is able to lie flat, but Dr. Lucie Tran recommended sleeping upright to help with reflux.  She is going to have Dr. Kathyrn Tran office fax his last office note.    Patient has had a spinal for childbirth many years ago.  She does not have a history of prior  back surgery, AS, thrombocytopenia, or anticoagulation therapy.  She is aware that even with a spinal, there is a small chance she could ultimately require general anesthesia.  A spinal may be better with her recent respiratory/allergy symptoms, but her coughing may make spinal anesthesia less optimal.  I am going to review her chart with an anesthesiologist once I receive additional records.   Annette Tran [facility]/Anesthesiology Phone [phone] 08/08/2013 5:31 PM  Addendum: 08/09/2013 5:15 PM I received a noted from Dr. Thad Tran.  It indicates that her chronic cough may have a possible asthma component.  He recommends considering a spinal anesthetic instead of general.  (See handwritten note from 08/09/2013.)  Records from [facility] also received.  PFT's on 07/10/13 showed FVC 2.56 (81%), FEV1 2.14 (89%), FEV1/FVC 84 (108%), FEF25-75% 2.55 (122%). Normal Pulmonary Function.  CXR on 07/10/13 showed the heart and pulmonary vascularity are WNL.  Lungs clear bilaterally.  No acute bony abnormality seen.  I reviewed above note/records with anesthesiologist Dr. Gelene Tran.  Patient had reported that she is continuing to feel better.  I did not notice any coughing during my conversation with her last evening.  She is now on additional meds that will hopefully further improve her symptoms.  Evaluation by her assigned anesthesiologist on the day of surgery, but it is anticipated that she would be a candidate for spinal anesthesia.

## 2013-08-09 NOTE — Anesthesia Preprocedure Evaluation (Addendum)
Anesthesia Evaluation  Patient identified by MRN, date of birth, ID band Patient awake    Reviewed: Allergy & Precautions, H&P , NPO status , Patient's Chart, lab work & pertinent test results  Airway Mallampati: II TM Distance: >3 FB Neck ROM: Full    Dental  (+) Teeth Intact   Pulmonary  breath sounds clear to auscultation+ rhonchi         Cardiovascular Rhythm:Regular Rate:Normal     Neuro/Psych    GI/Hepatic   Endo/Other    Renal/GU      Musculoskeletal   Abdominal   Peds  Hematology   Anesthesia Other Findings   Reproductive/Obstetrics                           Anesthesia Physical Anesthesia Plan  ASA: II  Anesthesia Plan: Spinal   Post-op Pain Management:    Induction:   Airway Management Planned: Natural Airway and Simple Face Mask  Additional Equipment:   Intra-op Plan:   Post-operative Plan:   Informed Consent: I have reviewed the patients History and Physical, chart, labs and discussed the procedure including the risks, benefits and alternatives for the proposed anesthesia with the patient or authorized representative who has indicated his/her understanding and acceptance.     Plan Discussed with: CRNA and Anesthesiologist  Anesthesia Plan Comments: (See my anesthesia note.  Allergist recommends spinal anesthesia.  Shonna Chock, PA-C)       Anesthesia Quick Evaluation

## 2013-08-13 MED ORDER — SODIUM CHLORIDE 0.9 % IV SOLN: INTRAVENOUS | Status: DC

## 2013-08-13 MED ORDER — TRANEXAMIC ACID 100 MG/ML IV SOLN
1000.0000 mg | INTRAVENOUS | Status: AC
  Administered 2013-08-14: 1000 mg via INTRAVENOUS
  Filled 2013-08-13: qty 10

## 2013-08-13 MED ORDER — CHLORHEXIDINE GLUCONATE 4 % EX LIQD: 60.0000 mL | Freq: Once | CUTANEOUS | Status: DC

## 2013-08-13 MED ORDER — CEFAZOLIN SODIUM-DEXTROSE 2-3 GM-% IV SOLR
2.0000 g | INTRAVENOUS | Status: AC
  Administered 2013-08-14: 2 g via INTRAVENOUS
  Filled 2013-08-13: qty 50

## 2013-08-13 MED ORDER — BUPIVACAINE LIPOSOME 1.3 % IJ SUSP
20.0000 mL | Freq: Once | INTRAMUSCULAR | Status: DC
  Filled 2013-08-13: qty 20

## 2013-08-14 ENCOUNTER — Encounter (HOSPITAL_COMMUNITY)
Admission: RE | Disposition: A | Discharge status: Home Health | Payer: Self-pay | Source: Ambulatory Visit | Attending: Orthopedic Surgery

## 2013-08-14 ENCOUNTER — Encounter (HOSPITAL_COMMUNITY): Payer: Self-pay | Admitting: *Deleted

## 2013-08-14 ENCOUNTER — Inpatient Hospital Stay (HOSPITAL_COMMUNITY)
Admission: RE | Admit: 2013-08-14 | Discharge: 2013-08-15 | DRG: 470 | Disposition: A | Discharge status: Home Health | Payer: Medicare Other | Source: Ambulatory Visit | Attending: Orthopedic Surgery | Admitting: Orthopedic Surgery

## 2013-08-14 ENCOUNTER — Inpatient Hospital Stay (HOSPITAL_COMMUNITY): Payer: Medicare Other | Admitting: Vascular Surgery

## 2013-08-14 ENCOUNTER — Encounter (HOSPITAL_COMMUNITY): Payer: Medicare Other | Admitting: Vascular Surgery

## 2013-08-14 DIAGNOSIS — Z888 Allergy status to other drugs, medicaments and biological substances status: Secondary | ICD-10-CM

## 2013-08-14 DIAGNOSIS — M1710 Unilateral primary osteoarthritis, unspecified knee: Principal | ICD-10-CM | POA: Diagnosis present

## 2013-08-14 DIAGNOSIS — Z96652 Presence of left artificial knee joint: Secondary | ICD-10-CM

## 2013-08-14 DIAGNOSIS — Z96659 Presence of unspecified artificial knee joint: Secondary | ICD-10-CM

## 2013-08-14 DIAGNOSIS — D62 Acute posthemorrhagic anemia: Secondary | ICD-10-CM | POA: Diagnosis not present

## 2013-08-14 HISTORY — DX: Gastro-esophageal reflux disease without esophagitis: K21.9

## 2013-08-14 HISTORY — DX: Unspecified asthma, uncomplicated: J45.909

## 2013-08-14 HISTORY — PX: TOTAL KNEE ARTHROPLASTY: SHX125

## 2013-08-14 HISTORY — DX: Unspecified osteoarthritis, unspecified site: M19.90

## 2013-08-14 HISTORY — DX: Other seasonal allergic rhinitis: J30.2

## 2013-08-14 HISTORY — DX: Major depressive disorder, single episode, unspecified: F32.9

## 2013-08-14 HISTORY — DX: Allergy to other foods: Z91.018

## 2013-08-14 HISTORY — DX: Pure hypercholesterolemia, unspecified: E78.00

## 2013-08-14 HISTORY — DX: Depression, unspecified: F32.A

## 2013-08-14 LAB — CBC
HCT: 35.6 % — ABNORMAL LOW (ref 36.0–46.0)
MCH: 30.2 pg (ref 26.0–34.0)
MCHC: 34.6 g/dL (ref 30.0–36.0)
MCV: 87.5 fL (ref 78.0–100.0)
Platelets: 201 10*3/uL (ref 150–400)
RDW: 13.6 % (ref 11.5–15.5)

## 2013-08-14 LAB — CREATININE, SERUM: GFR calc Af Amer: >90 mL/min (ref >90)

## 2013-08-14 SURGERY — ARTHROPLASTY, KNEE, TOTAL
Anesthesia: Regional | Site: Knee | Laterality: Left | Wound class: Clean

## 2013-08-14 MED ORDER — ONDANSETRON HCL 4 MG/2ML IJ SOLN
4.0000 mg | Freq: Four times a day (QID) | INTRAMUSCULAR | Status: DC | PRN
  Administered 2013-08-14 – 2013-08-15 (×2): 4 mg via INTRAVENOUS
  Filled 2013-08-14 (×2): qty 2

## 2013-08-14 MED ORDER — BUPIVACAINE LIPOSOME 1.3 % IJ SUSP
20.0000 mL | Freq: Once | INTRAMUSCULAR | Status: DC
  Filled 2013-08-14: qty 20

## 2013-08-14 MED ORDER — MENTHOL 3 MG MT LOZG: 1.0000 | LOZENGE | OROMUCOSAL | Status: DC | PRN

## 2013-08-14 MED ORDER — MONTELUKAST SODIUM 10 MG PO TABS
10.0000 mg | ORAL_TABLET | Freq: Every morning | ORAL | Status: DC
  Administered 2013-08-14 – 2013-08-15 (×2): 10 mg via ORAL
  Filled 2013-08-14 (×2): qty 1

## 2013-08-14 MED ORDER — ALBUTEROL SULFATE HFA 108 (90 BASE) MCG/ACT IN AERS: 2.0000 | INHALATION_SPRAY | Freq: Every day | RESPIRATORY_TRACT | Status: DC | PRN

## 2013-08-14 MED ORDER — ACETAMINOPHEN 325 MG PO TABS
650.0000 mg | ORAL_TABLET | Freq: Four times a day (QID) | ORAL | Status: DC | PRN
  Administered 2013-08-14: 650 mg via ORAL
  Filled 2013-08-14: qty 2

## 2013-08-14 MED ORDER — ALBUTEROL SULFATE (5 MG/ML) 0.5% IN NEBU: 2.5000 mg | INHALATION_SOLUTION | RESPIRATORY_TRACT | Status: DC | PRN

## 2013-08-14 MED ORDER — OXYCODONE HCL 5 MG PO TABS
5.0000 mg | ORAL_TABLET | ORAL | Status: DC | PRN
  Administered 2013-08-14 – 2013-08-15 (×2): 10 mg via ORAL
  Administered 2013-08-15: 5 mg via ORAL
  Administered 2013-08-15: 10 mg via ORAL
  Administered 2013-08-15: 5 mg via ORAL
  Administered 2013-08-15: 10 mg via ORAL
  Filled 2013-08-14: qty 2
  Filled 2013-08-14: qty 1
  Filled 2013-08-14 (×2): qty 2
  Filled 2013-08-14: qty 1
  Filled 2013-08-14: qty 2

## 2013-08-14 MED ORDER — DOCUSATE SODIUM 100 MG PO CAPS
100.0000 mg | ORAL_CAPSULE | Freq: Two times a day (BID) | ORAL | Status: DC
  Administered 2013-08-14 – 2013-08-15 (×3): 100 mg via ORAL
  Filled 2013-08-14 (×4): qty 1

## 2013-08-14 MED ORDER — METOCLOPRAMIDE HCL 5 MG/ML IJ SOLN
5.0000 mg | Freq: Three times a day (TID) | INTRAMUSCULAR | Status: DC | PRN
  Filled 2013-08-14: qty 2

## 2013-08-14 MED ORDER — ACETAMINOPHEN 650 MG RE SUPP: 650.0000 mg | Freq: Four times a day (QID) | RECTAL | Status: DC | PRN

## 2013-08-14 MED ORDER — OXYCODONE HCL ER 10 MG PO T12A
10.0000 mg | EXTENDED_RELEASE_TABLET | Freq: Two times a day (BID) | ORAL | Status: DC
  Administered 2013-08-14 – 2013-08-15 (×2): 10 mg via ORAL
  Filled 2013-08-14 (×2): qty 1

## 2013-08-14 MED ORDER — HYDROMORPHONE HCL PF 1 MG/ML IJ SOLN
1.0000 mg | INTRAMUSCULAR | Status: DC | PRN
  Administered 2013-08-14 (×3): 1 mg via INTRAVENOUS
  Filled 2013-08-14 (×3): qty 1

## 2013-08-14 MED ORDER — SODIUM CHLORIDE 0.9 % IR SOLN
Status: DC | PRN
  Administered 2013-08-14: 1000 mL
  Administered 2013-08-14: 3000 mL

## 2013-08-14 MED ORDER — SENNOSIDES-DOCUSATE SODIUM 8.6-50 MG PO TABS: 1.0000 | ORAL_TABLET | Freq: Every evening | ORAL | Status: DC | PRN

## 2013-08-14 MED ORDER — HYDROMORPHONE HCL PF 1 MG/ML IJ SOLN: 0.2500 mg | INTRAMUSCULAR | Status: DC | PRN

## 2013-08-14 MED ORDER — EPHEDRINE SULFATE 50 MG/ML IJ SOLN
INTRAMUSCULAR | Status: DC | PRN
  Administered 2013-08-14: 10 mg via INTRAVENOUS

## 2013-08-14 MED ORDER — CEFAZOLIN SODIUM 1-5 GM-% IV SOLN
1.0000 g | Freq: Four times a day (QID) | INTRAVENOUS | Status: AC
  Administered 2013-08-14 (×2): 1 g via INTRAVENOUS
  Filled 2013-08-14 (×2): qty 50

## 2013-08-14 MED ORDER — FLEET ENEMA 7-19 GM/118ML RE ENEM: 1.0000 | ENEMA | Freq: Once | RECTAL | Status: AC | PRN

## 2013-08-14 MED ORDER — ONDANSETRON HCL 4 MG/2ML IJ SOLN: 4.0000 mg | Freq: Once | INTRAMUSCULAR | Status: DC | PRN

## 2013-08-14 MED ORDER — PROPOFOL 10 MG/ML IV BOLUS
INTRAVENOUS | Status: DC | PRN
  Administered 2013-08-14: 20 mg via INTRAVENOUS

## 2013-08-14 MED ORDER — PHENYLEPHRINE HCL 10 MG/ML IJ SOLN
INTRAMUSCULAR | Status: DC | PRN
  Administered 2013-08-14 (×2): 40 ug via INTRAVENOUS
  Administered 2013-08-14: 80 ug via INTRAVENOUS

## 2013-08-14 MED ORDER — PHENOL 1.4 % MT LIQD: 1.0000 | OROMUCOSAL | Status: DC | PRN

## 2013-08-14 MED ORDER — FENTANYL CITRATE 0.05 MG/ML IJ SOLN
INTRAMUSCULAR | Status: DC | PRN
  Administered 2013-08-14: 50 ug via INTRAVENOUS

## 2013-08-14 MED ORDER — LACTATED RINGERS IV SOLN
INTRAVENOUS | Status: DC | PRN
  Administered 2013-08-14 (×2): via INTRAVENOUS

## 2013-08-14 MED ORDER — ONDANSETRON HCL 4 MG/2ML IJ SOLN
INTRAMUSCULAR | Status: DC | PRN
  Administered 2013-08-14: 4 mg via INTRAMUSCULAR

## 2013-08-14 MED ORDER — BISACODYL 5 MG PO TBEC: 5.0000 mg | DELAYED_RELEASE_TABLET | Freq: Every day | ORAL | Status: DC | PRN

## 2013-08-14 MED ORDER — LIDOCAINE HCL (CARDIAC) 20 MG/ML IV SOLN
INTRAVENOUS | Status: DC | PRN
  Administered 2013-08-14: 40 mg via INTRAVENOUS

## 2013-08-14 MED ORDER — METOCLOPRAMIDE HCL 5 MG PO TABS
5.0000 mg | ORAL_TABLET | Freq: Three times a day (TID) | ORAL | Status: DC | PRN
  Filled 2013-08-14: qty 2

## 2013-08-14 MED ORDER — CELECOXIB 200 MG PO CAPS
200.0000 mg | ORAL_CAPSULE | Freq: Two times a day (BID) | ORAL | Status: DC
  Administered 2013-08-14 – 2013-08-15 (×3): 200 mg via ORAL
  Filled 2013-08-14 (×4): qty 1

## 2013-08-14 MED ORDER — SODIUM CHLORIDE 0.9 % IV SOLN
INTRAVENOUS | Status: DC
  Administered 2013-08-14 – 2013-08-15 (×2): via INTRAVENOUS

## 2013-08-14 MED ORDER — ZOLPIDEM TARTRATE 5 MG PO TABS: 5.0000 mg | ORAL_TABLET | Freq: Every evening | ORAL | Status: DC | PRN

## 2013-08-14 MED ORDER — ENOXAPARIN SODIUM 30 MG/0.3ML ~~LOC~~ SOLN
30.0000 mg | Freq: Two times a day (BID) | SUBCUTANEOUS | Status: DC
  Administered 2013-08-15: 30 mg via SUBCUTANEOUS
  Filled 2013-08-14 (×3): qty 0.3

## 2013-08-14 MED ORDER — MIDAZOLAM HCL 5 MG/5ML IJ SOLN
INTRAMUSCULAR | Status: DC | PRN
  Administered 2013-08-14 (×2): 2 mg via INTRAVENOUS

## 2013-08-14 MED ORDER — DIPHENHYDRAMINE HCL 12.5 MG/5ML PO ELIX
12.5000 mg | ORAL_SOLUTION | ORAL | Status: DC | PRN
  Administered 2013-08-15: 12.5 mg via ORAL
  Filled 2013-08-14: qty 10

## 2013-08-14 MED ORDER — KETOROLAC TROMETHAMINE 30 MG/ML IJ SOLN: 15.0000 mg | Freq: Once | INTRAMUSCULAR | Status: DC | PRN

## 2013-08-14 MED ORDER — ALUM & MAG HYDROXIDE-SIMETH 200-200-20 MG/5ML PO SUSP: 30.0000 mL | ORAL | Status: DC | PRN

## 2013-08-14 MED ORDER — SODIUM CHLORIDE 0.9 % IJ SOLN
INTRAMUSCULAR | Status: DC | PRN
  Administered 2013-08-14: 09:00:00

## 2013-08-14 MED ORDER — METHOCARBAMOL 500 MG PO TABS
500.0000 mg | ORAL_TABLET | Freq: Four times a day (QID) | ORAL | Status: DC | PRN
  Administered 2013-08-14 – 2013-08-15 (×3): 500 mg via ORAL
  Filled 2013-08-14 (×3): qty 1

## 2013-08-14 MED ORDER — PROPOFOL INFUSION 10 MG/ML OPTIME
INTRAVENOUS | Status: DC | PRN
  Administered 2013-08-14: 50 ug/kg/min via INTRAVENOUS

## 2013-08-14 MED ORDER — ONDANSETRON HCL 4 MG PO TABS: 4.0000 mg | ORAL_TABLET | Freq: Four times a day (QID) | ORAL | Status: DC | PRN

## 2013-08-14 MED ORDER — BUPIVACAINE HCL (PF) 0.5 % IJ SOLN
INTRAMUSCULAR | Status: DC | PRN
  Administered 2013-08-14: 30 mL

## 2013-08-14 MED ORDER — METHOCARBAMOL 100 MG/ML IJ SOLN
500.0000 mg | Freq: Four times a day (QID) | INTRAVENOUS | Status: DC | PRN
  Filled 2013-08-14: qty 5

## 2013-08-14 SURGICAL SUPPLY — 66 items
BANDAGE ESMARK 6X9 LF (GAUZE/BANDAGES/DRESSINGS) ×1 IMPLANT
BLADE SAGITTAL 13X1.27X60 (BLADE) ×2 IMPLANT
BLADE SAW SGTL 83.5X18.5 (BLADE) ×2 IMPLANT
BNDG ESMARK 6X9 LF (GAUZE/BANDAGES/DRESSINGS) ×2
BOWL SMART MIX CTS (DISPOSABLE) ×2 IMPLANT
CAP POR TM CP VIT E LN CER HD ×2 IMPLANT
CEMENT BONE SIMPLEX SPEEDSET (Cement) ×4 IMPLANT
CLOTH BEACON ORANGE TIMEOUT ST (SAFETY) ×2 IMPLANT
COVER SURGICAL LIGHT HANDLE (MISCELLANEOUS) ×2 IMPLANT
CUFF TOURNIQUET SINGLE 34IN LL (TOURNIQUET CUFF) ×2 IMPLANT
DRAPE EXTREMITY T 121X128X90 (DRAPE) ×2 IMPLANT
DRAPE INCISE IOBAN 66X45 STRL (DRAPES) ×4 IMPLANT
DRAPE PROXIMA HALF (DRAPES) ×2 IMPLANT
DRAPE U-SHAPE 47X51 STRL (DRAPES) ×2 IMPLANT
DRSG ADAPTIC 3X8 NADH LF (GAUZE/BANDAGES/DRESSINGS) ×2 IMPLANT
DRSG PAD ABDOMINAL 8X10 ST (GAUZE/BANDAGES/DRESSINGS) ×2 IMPLANT
DURAPREP 26ML APPLICATOR (WOUND CARE) ×2 IMPLANT
ELECT REM PT RETURN 9FT ADLT (ELECTROSURGICAL) ×2
ELECTRODE REM PT RTRN 9FT ADLT (ELECTROSURGICAL) ×1 IMPLANT
EVACUATOR 1/8 PVC DRAIN (DRAIN) ×2 IMPLANT
GLOVE BIOGEL M 7.0 STRL (GLOVE) IMPLANT
GLOVE BIOGEL PI IND STRL 6.5 (GLOVE) ×1 IMPLANT
GLOVE BIOGEL PI IND STRL 7.0 (GLOVE) ×1 IMPLANT
GLOVE BIOGEL PI IND STRL 7.5 (GLOVE) IMPLANT
GLOVE BIOGEL PI IND STRL 8.5 (GLOVE) ×2 IMPLANT
GLOVE BIOGEL PI INDICATOR 6.5 (GLOVE) ×1
GLOVE BIOGEL PI INDICATOR 7.0 (GLOVE) ×1
GLOVE BIOGEL PI INDICATOR 7.5 (GLOVE)
GLOVE BIOGEL PI INDICATOR 8.5 (GLOVE) ×2
GLOVE BIOGEL PI ORTHO PRO 7.5 (GLOVE) ×1
GLOVE ORTHOPEDIC STR SZ6.5 (GLOVE) ×2 IMPLANT
GLOVE PI ORTHO PRO STRL 7.5 (GLOVE) ×1 IMPLANT
GLOVE SURG ORTHO 8.0 STRL STRW (GLOVE) ×4 IMPLANT
GLOVE SURG SS PI 7.5 STRL IVOR (GLOVE) ×2 IMPLANT
GOWN PREVENTION PLUS XLARGE (GOWN DISPOSABLE) ×4 IMPLANT
GOWN SRG XL XLNG 56XLVL 4 (GOWN DISPOSABLE) ×1 IMPLANT
GOWN STRL NON-REIN LRG LVL3 (GOWN DISPOSABLE) ×2 IMPLANT
GOWN STRL NON-REIN XL XLG LVL4 (GOWN DISPOSABLE) ×1
HANDPIECE INTERPULSE COAX TIP (DISPOSABLE) ×1
HOOD PEEL AWAY FACE SHEILD DIS (HOOD) ×8 IMPLANT
KIT BASIN OR (CUSTOM PROCEDURE TRAY) ×2 IMPLANT
KIT ROOM TURNOVER OR (KITS) ×2 IMPLANT
MANIFOLD NEPTUNE II (INSTRUMENTS) ×2 IMPLANT
NEEDLE 22X1 1/2 (OR ONLY) (NEEDLE) ×2 IMPLANT
NEEDLE HYPO 21X1.5 SAFETY (NEEDLE) ×2 IMPLANT
NS IRRIG 1000ML POUR BTL (IV SOLUTION) ×2 IMPLANT
PACK TOTAL JOINT (CUSTOM PROCEDURE TRAY) ×2 IMPLANT
PAD ARMBOARD 7.5X6 YLW CONV (MISCELLANEOUS) ×2 IMPLANT
PADDING CAST COTTON 6X4 STRL (CAST SUPPLIES) ×2 IMPLANT
POSITIONER HEAD PRONE TRACH (MISCELLANEOUS) ×2 IMPLANT
SET HNDPC FAN SPRY TIP SCT (DISPOSABLE) ×1 IMPLANT
SPONGE GAUZE 4X4 12PLY (GAUZE/BANDAGES/DRESSINGS) ×2 IMPLANT
STAPLER VISISTAT 35W (STAPLE) ×2 IMPLANT
SUCTION FRAZIER TIP 10 FR DISP (SUCTIONS) IMPLANT
SUT BONE WAX W31G (SUTURE) ×2 IMPLANT
SUT VIC AB 0 CTB1 27 (SUTURE) ×4 IMPLANT
SUT VIC AB 1 CT1 27 (SUTURE) ×2
SUT VIC AB 1 CT1 27XBRD ANBCTR (SUTURE) ×2 IMPLANT
SUT VIC AB 2-0 CT1 27 (SUTURE) ×2
SUT VIC AB 2-0 CT1 TAPERPNT 27 (SUTURE) ×2 IMPLANT
SYR 50ML LL SCALE MARK (SYRINGE) ×2 IMPLANT
SYR CONTROL 10ML LL (SYRINGE) ×2 IMPLANT
TOWEL OR 17X24 6PK STRL BLUE (TOWEL DISPOSABLE) ×2 IMPLANT
TOWEL OR 17X26 10 PK STRL BLUE (TOWEL DISPOSABLE) ×2 IMPLANT
TRAY FOLEY CATH 16FRSI W/METER (SET/KITS/TRAYS/PACK) ×2 IMPLANT
WATER STERILE IRR 1000ML POUR (IV SOLUTION) ×2 IMPLANT

## 2013-08-14 NOTE — Evaluation (Signed)
Physical Therapy Evaluation Patient Details Name: Annette Tran MRN: [id number] DOB: [birth year] Today's Date: 08/14/2013 Time: 4098-1191 PT Time Calculation (min): 37 min  PT Assessment / Plan / Recommendation History of Present Illness  s/p LTKA  Clinical Impression  Patient is s/p above surgery resulting in functional limitations due to the deficits listed below (see PT Problem List).  Patient will benefit from skilled PT to increase their independence and safety with mobility to allow discharge to the venue listed below.       PT Assessment  Patient needs continued PT services    Follow Up Recommendations  Home health PT;Supervision/Assistance - 24 hour    Does the patient have the potential to tolerate intense rehabilitation      Barriers to Discharge        Equipment Recommendations  Rolling walker with 5" wheels;3in1 (PT)    Recommendations for Other Services OT consult   Frequency 7X/week    Precautions / Restrictions Precautions Precautions: Knee Restrictions Weight Bearing Restrictions: Yes LLE Weight Bearing: Weight bearing as tolerated   Pertinent Vitals/Pain 9/10 knee pain once in chair; RN notified      Mobility  Bed Mobility Bed Mobility: Supine to Sit;Sitting - Scoot to Edge of Bed Supine to Sit: 4: Min assist;With rails Sitting - Scoot to [place] of Bed: 4: Min assist Details for Bed Mobility Assistance: Overall smooth transition Transfers Transfers: Sit to Stand;Stand to Sit Sit to Stand: 4: Min assist Stand to Sit: 4: Min assist Details for Transfer Assistance: Cues for technique, safety, hand placement Ambulation/Gait Ambulation/Gait Assistance: 4: Min guard Ambulation Distance (Feet): 3 Feet Assistive device: Rolling walker Ambulation/Gait Assistance Details: pivot steps to chair; cues for sequence and to activate quad for stance stability Gait Pattern: Step-to pattern    Exercises Total Joint Exercises Quad Sets: AROM;Left;10 reps Heel  Slides: AAROM;Left (2) Straight Leg Raises: AAROM;Left;5 reps   PT Diagnosis: Difficulty walking;Acute pain  PT Problem List: Decreased strength;Decreased range of motion;Decreased activity tolerance;Decreased balance;Decreased knowledge of use of DME;Pain PT Treatment Interventions: DME instruction;Gait training;Stair training;Functional mobility training;Therapeutic activities;Therapeutic exercise;Balance training;Patient/family education     PT Goals(Current goals can be found in the care plan section) Acute Rehab PT Goals Patient Stated Goal: hopes to go to [place] next year PT Goal Formulation: With patient Time For Goal Achievement: 08/21/13 Potential to Achieve Goals: Good  Visit Information  Last PT Received On: 08/14/13 Assistance Needed: +1 History of Present Illness: s/p LTKA       Prior Functioning  Home Living Family/patient expects to be discharged to:: Private residence Living Arrangements: Alone Available Help at Discharge: Family;Friend(s);Available 24 hours/day Type of Home: House Home Access: Stairs to enter [employer] of Steps: 2 Entrance Stairs-Rails: None Home Layout: Two level;Able to live on main level with bedroom/bathroom Home Equipment: Dan Humphreys - 2 wheels;Bedside commode Prior Function Level of Independence: Independent 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 Dominant Hand: Right    Cognition  Cognition Arousal/Alertness: Awake/alert Behavior During Therapy: WFL for tasks assessed/performed Overall Cognitive Status: Within Functional Limits for tasks assessed    Extremity/Trunk Assessment Upper Extremity Assessment Upper Extremity Assessment: Overall WFL for tasks assessed Lower Extremity Assessment Lower Extremity Assessment: LLE deficits/detail LLE Deficits / Details: Grossly decr strength postop; quad activation present   Balance    End of Session PT - End of Session Equipment Utilized During  Treatment: Gait belt Activity Tolerance: Patient tolerated treatment well (limited by nausea) Patient left: in chair;with call  bell/phone within reach Nurse Communication: Mobility status  GP     Van Clines [facility] [place], [name] [phone]  08/14/2013, 4:57 PM

## 2013-08-14 NOTE — Progress Notes (Signed)
Orthopedic Tech Progress Note Patient Details:  Annette Tran 1946-10-18 [id number]  CPM Left Knee CPM Left Knee: On Left Knee Flexion (Degrees): 90 Left Knee Extension (Degrees): 0 Additional Comments: Trapeze bar and foot roll   [name] 08/14/2013, 12:13 PM

## 2013-08-14 NOTE — Anesthesia Procedure Notes (Addendum)
Spinal  Patient location during procedure: OR Start time: 08/14/2013 7:35 AM End time: 08/14/2013 7:40 AM Preanesthetic Checklist Completed: patient identified, site marked, surgical consent, pre-op evaluation, timeout performed, IV checked, risks and benefits discussed and monitors and equipment checked Spinal Block Patient position: right lateral decubitus Prep: Betadine Patient monitoring: heart rate, cardiac monitor, continuous pulse ox and blood pressure Approach: midline Location: L5-S1 Injection technique: single-shot Needle Needle type: Tuohy  Needle gauge: 22 G Needle length: 9 cm Needle insertion depth: 5 cm Additional Notes  Clear CSF, 1 cc 0.75% bupivacaine injected easily  Anesthesia Regional Block:  Adductor canal block  Pre-Anesthetic Checklist: ,, timeout performed, Correct Patient, Correct Site, Correct Laterality, Correct Procedure, Correct Position, site marked, Risks and benefits discussed,  Surgical consent,  Pre-op evaluation,  At surgeon's request and post-op pain management  Laterality: Left  Prep: chloraprep       Needles:  Injection technique: Single-shot  Needle Type: Echogenic Stimulator Needle      Needle Gauge: 22 and 22 G    Additional Needles:  Procedures: ultrasound guided (picture in chart) Adductor canal block Narrative:  Start time: 08/14/2013 7:15 AM End time: 08/14/2013 7:25 AM Injection made incrementally with aspirations every 5 mL.  Performed by: Personally   Additional Notes: 30 cc o.5% marcaine with Epinephrine injected easily  Adductor canal block Date/Time: 08/14/2013 7:35 AM Performed by: Orvilla Fus A Oxygen Delivery Method: Simple face mask Placement Confirmation: positive ETCO2    Anesthesia Regional Block:   Narrative:

## 2013-08-14 NOTE — Progress Notes (Signed)
UR completed. Onelia Cadmus RN CCM Case Mgmt 

## 2013-08-14 NOTE — Transfer of Care (Signed)
Immediate Anesthesia Transfer of Care Note  Patient: Annette Tran  Procedure(s) Performed: Procedure(s): LEFT TOTAL KNEE ARTHROPLASTY (Left)  Patient Location: PACU  Anesthesia Type:MAC, Regional and Spinal  Level of Consciousness: awake, alert  and oriented  Airway & Oxygen Therapy: Patient Spontanous Breathing and Patient connected to nasal cannula oxygen  Post-op Assessment: Report given to PACU RN, Post -op Vital signs reviewed and stable and Patient moving all extremities  Post vital signs: Reviewed and stable  Complications: No apparent anesthesia complications

## 2013-08-14 NOTE — H&P (Signed)
Annette Tran MRN:  [id number] DOB/SEX:  12/26/1945/female  CHIEF COMPLAINT:  Painful left Knee  HISTORY: Patient is a 67 y.o. female presented with a history of pain in the left knee. Onset of symptoms was gradual starting several years ago with gradually worsening course since that time. Prior procedures on the knee include none. Patient has been treated conservatively with over-the-counter NSAIDs and activity modification. Patient currently rates pain in the knee at 10 out of 10 with activity. There is pain at night.  PAST MEDICAL HISTORY: Patient Active Problem List   Diagnosis Date Noted  . Chest pain 11/08/2012  . PONV (postoperative nausea and vomiting)   . Arthritis   . Neuromuscular disorder    Past Medical History  Diagnosis Date  . PONV (postoperative nausea and vomiting)   . Arthritis   . Neuromuscular disorder     abd tightness and numbness after c-section  . Shortness of breath     started 1 mth ago (read note)   Past Surgical History  Procedure Laterality Date  . Cesarean section  1975  . Eye surgery      [place]  1999  . Abdominal hysterectomy  1997  . Total knee arthroplasty  10/24/2012    Procedure: TOTAL KNEE ARTHROPLASTY;  Surgeon: Dannielle Huh, MD;  Location: [facility] OR;  Service: Orthopedics;  Laterality: Right;  right total knee arthroplasty     MEDICATIONS:   Prescriptions prior to admission  Medication Sig Dispense Refill  . albuterol (PROVENTIL HFA;VENTOLIN HFA) 108 (90 BASE) MCG/ACT inhaler Inhale 2 puffs into the lungs daily as needed for wheezing or shortness of breath (coughing).       Marland Kitchen albuterol (PROVENTIL) (2.5 MG/3ML) 0.083% nebulizer solution Take 2.5 mg by nebulization every 6 (six) hours as needed for wheezing or shortness of breath.      . dextromethorphan-guaiFENesin (MUCINEX DM) 30-600 MG per 12 hr tablet Take 1 tablet by mouth every 12 (twelve) hours as needed (cough/congestion).      . montelukast (SINGULAIR) 10 MG tablet Take 10 mg by  mouth every morning.      . naproxen sodium (ALEVE) 220 MG tablet Take 440 mg by mouth daily as needed (pain). For pain      . promethazine-codeine (PHENERGAN WITH CODEINE) 6.25-10 MG/5ML syrup Take 5 mLs by mouth at bedtime as needed for cough.        ALLERGIES:   Allergies  Allergen Reactions  . Demerol [Meperidine]     Hallucinations  . Other Nausea And Vomiting    Avocado, scallops   . Doxycycline Palpitations    REVIEW OF SYSTEMS:  Pertinent items are noted in HPI.   FAMILY HISTORY:  History reviewed. No pertinent family history.  SOCIAL HISTORY:   History  Substance Use Topics  . Smoking status: Never Smoker   . Smokeless tobacco: Never Used  . Alcohol Use: No     EXAMINATION:  Vital signs in last 24 hours: Temp:  [97 F (36.1 C)] 97 F (36.1 C) (10/13 0605) Pulse Rate:  [80] 80 (10/13 0605) Resp:  [18] 18 (10/13 0605) BP: (122)/(84) 122/84 mmHg (10/13 0605) SpO2:  [98 %] 98 % (10/13 0605)  General appearance: alert, cooperative and no distress Lungs: clear to auscultation bilaterally Heart: regular rate and rhythm, S1, S2 normal, no murmur, click, rub or gallop Abdomen: soft, non-tender; bowel sounds normal; no masses,  no organomegaly Extremities: extremities normal, atraumatic, no cyanosis or edema and Homans sign is negative, no sign of  DVT Pulses: 2+ and symmetric Skin: Skin color, texture, turgor normal. No rashes or lesions Neurologic: Alert and oriented X 3, normal strength and tone. Normal symmetric reflexes. Normal coordination and gait  Musculoskeletal:  ROM 0-115, Ligaments INTACT,  Imaging Review Plain radiographs demonstrate severe degenerative joint disease of the left knee. The overall alignment is mild valgus. The bone quality appears to be good for age and reported activity level.  Assessment/Plan: End stage arthritis, left knee   The patient history, physical examination and imaging studies are consistent with advanced degenerative  joint disease of the left knee. The patient has failed conservative treatment.  The clearance notes were reviewed.  After discussion with the patient it was felt that Total Knee Replacement was indicated. The procedure,  risks, and benefits of total knee arthroplasty were presented and reviewed. The risks including but not limited to aseptic loosening, infection, blood clots, vascular injury, stiffness, patella tracking problems complications among others were discussed. The patient acknowledged the explanation, agreed to proceed with the plan.  Annette Tran 08/14/2013, 6:39 AM

## 2013-08-15 LAB — BASIC METABOLIC PANEL
BUN: 7 mg/dL (ref 6–23)
CO2: 27 mEq/L (ref 19–32)
Chloride: 101 mEq/L (ref 96–112)
GFR calc non Af Amer: >90 mL/min (ref >90)
Glucose, Bld: 110 mg/dL — ABNORMAL HIGH (ref 70–99)
Potassium: 3.8 mEq/L (ref 3.5–5.1)

## 2013-08-15 LAB — CBC
HCT: 33 % — ABNORMAL LOW (ref 36.0–46.0)
Hemoglobin: 11.3 g/dL — ABNORMAL LOW (ref 12.0–15.0)
MCH: 30.1 pg (ref 26.0–34.0)
MCHC: 34.2 g/dL (ref 30.0–36.0)
Platelets: 177 10*3/uL (ref 150–400)

## 2013-08-15 MED ORDER — ENOXAPARIN SODIUM 40 MG/0.4ML ~~LOC~~ SOLN: 40.0000 mg | SUBCUTANEOUS | Status: DC

## 2013-08-15 MED ORDER — OXYCODONE HCL ER 10 MG PO T12A: 10.0000 mg | EXTENDED_RELEASE_TABLET | Freq: Two times a day (BID) | ORAL | Status: DC

## 2013-08-15 MED ORDER — OXYCODONE HCL 5 MG PO TABS: 5.0000 mg | ORAL_TABLET | ORAL | Status: DC | PRN

## 2013-08-15 NOTE — Evaluation (Signed)
Occupational Therapy Evaluation and Discharge Patient Details Name: Akansha Wyche MRN: [id number] DOB: 1946/10/05 Today's Date: 08/15/2013 Time: 4098-1191 OT Time Calculation (min): 17 min  OT Assessment / Plan / Recommendation History of present illness s/p LTKA   Clinical Impression   This 67 yo female s/p above presents to acute OT with all education completed. Pt will have son with her until Sunday and then she will have intermittent A from friends. No further OT needs, will sign off.    OT Assessment  Patient does not need any further OT services    Follow Up Recommendations  No OT follow up       Equipment Recommendations  None recommended by OT          Precautions / Restrictions Precautions Precautions: Knee Restrictions Weight Bearing Restrictions: Yes LLE Weight Bearing: Weight bearing as tolerated   Pertinent Vitals/Pain 7/10 in knee; pre-medicated, repositioned    ADL  Equipment Used: Rolling walker Transfers/Ambulation Related to ADLs: S for all with RW from recliner into bathroom to 3n1 over toliet ADL Comments: set/S for all from a RW level, able to doff/donn socks without AE and donn underwear without AE     Acute Rehab OT Goals Patient Stated Goal: Home today hopefully  Visit Information  Last OT Received On: 08/15/13 Assistance Needed: +1 History of Present Illness: s/p LTKA       Prior Functioning     Home Living Family/patient expects to be discharged to:: Private residence Living Arrangements: Alone Available Help at Discharge: Family;Friend(s);Available 24 hours/day (until Sunday) Type of Home: House Home Access: Stairs to enter [employer] of Steps: 2 Entrance Stairs-Rails: None Home Layout: Two level;Able to live on main level with bedroom/bathroom Home Equipment: Dan Humphreys - 2 wheels;Bedside commode Prior Function Level of Independence: Independent 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 Dominant Hand: Right         Vision/Perception Vision - History Patient Visual Report: No change from baseline   Cognition  Cognition Arousal/Alertness: Awake/alert Behavior During Therapy: WFL for tasks assessed/performed Overall Cognitive Status: Within Functional Limits for tasks assessed    Extremity/Trunk Assessment Upper Extremity Assessment Upper Extremity Assessment: Overall WFL for tasks assessed     Mobility Bed Mobility Details for Bed Mobility Assistance: Pt up in recliner upon arrival Transfers Transfers: Sit to Stand;Stand to Sit Sit to Stand: 5: Supervision;With upper extremity assist;With armrests;From chair/3-in-1 Stand to Sit: 5: Supervision;With upper extremity assist;With armrests;To chair/3-in-1 Details for Transfer Assistance: cues to reinfoce hand placement & LLE positioning for comfort with descent           End of Session OT - End of Session Equipment Utilized During Treatment: Rolling walker Activity Tolerance: Patient tolerated treatment well Patient left:  (On 3n1 in bathroom, to pull call bell when finshed)        Evette Georges [phone] 08/15/2013, 9:40 AM

## 2013-08-15 NOTE — Progress Notes (Signed)
Physical Therapy Treatment Patient Details Name: Annette Tran MRN: [id number] DOB: December 14, 1945 Today's Date: 08/15/2013 Time: 4098-1191 PT Time Calculation (min): 25 min  PT Assessment / Plan / Recommendation  History of Present Illness s/p LTKA   PT Comments   Pt progressing well with PT goals & mobility.  Pt eager to d/c home later today.  Needs to practice steps next session.     Follow Up Recommendations  Home health PT;Supervision/Assistance - 24 hour     Does the patient have the potential to tolerate intense rehabilitation     Barriers to Discharge        Equipment Recommendations  Rolling walker with 5" wheels;3in1 (PT)    Recommendations for Other Services OT consult  Frequency 7X/week   Progress towards PT Goals Progress towards PT goals: Progressing toward goals  Plan Current plan remains appropriate    Precautions / Restrictions Precautions Precautions: Knee Restrictions LLE Weight Bearing: Weight bearing as tolerated   Pertinent Vitals/Pain 7/10 Lt knee.  RN administered pain meds.    Mobility  Bed Mobility Bed Mobility: Supine to Sit;Sitting - Scoot to Edge of Bed Supine to Sit: 4: Min assist;HOB flat Sitting - Scoot to [employer] of Bed: 4: Min assist Details for Bed Mobility Assistance: (A) for LLE management Transfers Transfers: Sit to Stand;Stand to Sit Sit to Stand: 4: Min guard;With upper extremity assist;With armrests;From bed;From chair/3-in-1 Stand to Sit: 4: Min guard;With upper extremity assist;With armrests;To chair/3-in-1 Details for Transfer Assistance: cues to reinfoce hand placement & LLE positioning for comfort with descent Ambulation/Gait Ambulation/Gait Assistance: 4: Min guard Ambulation Distance (Feet): 60 Feet Assistive device: Rolling walker Ambulation/Gait Assistance Details: cues for sequencing intiially but then able to perform without cues.  Demonstrated good quad control during stance phase Gait Pattern: Step-to  pattern;Decreased weight shift to left Stairs: No Wheelchair Mobility Wheelchair Mobility: No    Exercises Total Joint Exercises Ankle Circles/Pumps: AROM;Both;10 reps Quad Sets: AROM;Both;10 reps Straight Leg Raises: AROM;Strengthening;Left;10 reps;Supine     PT Goals (current goals can now be found in the care plan section) Acute Rehab PT Goals PT Goal Formulation: With patient Time For Goal Achievement: 08/21/13 Potential to Achieve Goals: Good  Visit Information  Last PT Received On: 08/15/13 Assistance Needed: +1 History of Present Illness: s/p LTKA    Subjective Data      Cognition  Cognition Arousal/Alertness: Awake/alert Behavior During Therapy: WFL for tasks assessed/performed Overall Cognitive Status: Within Functional Limits for tasks assessed    Balance     End of Session PT - End of Session Equipment Utilized During Treatment: Gait belt Activity Tolerance: Patient tolerated treatment well Patient left: in chair;with call bell/phone within reach Nurse Communication: Mobility status CPM Left Knee CPM Left Knee: Off Left Knee Flexion (Degrees): 60 Left Knee Extension (Degrees): 0   GP     Annette Tran 08/15/2013, 8:38 AM  Annette Tran, PTA [phone] 08/15/2013

## 2013-08-15 NOTE — Progress Notes (Signed)
Patient discharged to home this afternoon.  IV removed, IV site clean, dry, and intact.  Discharge education performed in presence of patient and her son.  Patient voiced understanding of discharge instructions and medications. 

## 2013-08-15 NOTE — Progress Notes (Signed)
SPORTS MEDICINE AND JOINT REPLACEMENT  Georgena Spurling, MD   Altamese Cabal, PA-C [address] [place], [place], [place]  [address]                             [phone]   PROGRESS NOTE  Subjective:  negative for Chest Pain  negative for Shortness of Breath  negative for Nausea/Vomiting   negative for Calf Pain  negative for Bowel Movement   Tolerating Diet: yes         Patient reports pain as 5 on 0-10 scale.    Objective: Vital signs in last 24 hours:   Patient Vitals for the past 24 hrs:  BP Temp Temp src Pulse Resp SpO2  08/15/13 1231 93/55 mmHg 98.1 F (36.7 C) - 82 16 97 %  08/15/13 0800 - - - - 16 -  08/15/13 0533 105/81 mmHg 97.3 F (36.3 C) Oral 95 18 98 %  08/15/13 0400 - - - - 17 95 %  08/15/13 0134 101/52 mmHg 97.6 F (36.4 C) Oral 66 18 99 %  08/15/13 0000 - - - - 17 97 %  08/14/13 2041 109/63 mmHg 97.4 F (36.3 C) Oral 86 18 99 %  08/14/13 2000 - - - - 17 99 %    @flow {1959:LAST@   Intake/Output from previous day:   10/13 0701 - 10/14 0700 In: 2000 [P.O.:250; I.V.:1750] Out: 2745 [Urine:2410; Drains:335]   Intake/Output this shift:   10/14 0701 - 10/14 1900 In: 1418.8 [I.V.:1418.8] Out: -    Intake/Output     10/13 0701 - 10/14 0700 10/14 0701 - 10/15 0700   P.O. 250    I.V. 1750 1418.8   Total Intake 2000 1418.8   Urine 2410    Drains 335    Total Output 2745     Net -745 +1418.8           LABORATORY DATA:  Recent Labs  08/14/13 1152 08/15/13 0500  WBC 9.7 9.2  HGB 12.3 11.3*  HCT 35.6* 33.0*  PLT 201 177    Recent Labs  08/14/13 1152 08/15/13 0500  NA  --  135  K  --  3.8  CL  --  101  CO2  --  27  BUN  --  7  CREATININE 0.60 0.57  GLUCOSE  --  110*  CALCIUM  --  8.7   Lab Results  Component Value Date   INR 0.93 08/04/2013   INR 0.95 11/08/2012   INR 0.92 10/20/2012    Examination:  General appearance: alert, cooperative and no distress Extremities: Homans sign is negative, no sign of DVT  Wound Exam: clean,  dry, intact   Drainage:  Scant/small amount Serosanguinous exudate  Motor Exam: EHL and FHL Intact  Sensory Exam: Deep Peroneal normal   Assessment:    1 Day Post-Op  Procedure(s) (LRB): LEFT TOTAL KNEE ARTHROPLASTY (Left)  ADDITIONAL DIAGNOSIS:  Active Problems:   * No active hospital problems. *  Acute Blood Loss Anemia   Plan: Physical Therapy as ordered Weight Bearing as Tolerated (WBAT)  DVT Prophylaxis:  Lovenox  DISCHARGE PLAN: Home  DISCHARGE NEEDS: HHPT, CPM, Walker and 3-in-1 comode seat         Araseli Sherry 08/15/2013, 2:29 PM

## 2013-08-15 NOTE — Anesthesia Postprocedure Evaluation (Signed)
  Anesthesia Post-op Note  Patient: Annette Tran  Procedure(s) Performed: Procedure(s): LEFT TOTAL KNEE ARTHROPLASTY (Left)  Patient Location: PACU  Anesthesia Type:General  Level of Consciousness: awake, alert  and oriented  Airway and Oxygen Therapy: Patient Spontanous Breathing and Patient connected to nasal cannula oxygen  Post-op Pain: mild  Post-op Assessment: Post-op Vital signs reviewed, Patient's Cardiovascular Status Stable, Respiratory Function Stable, Patent Airway and Pain level controlled  Post-op Vital Signs: stable  Complications: No apparent anesthesia complications

## 2013-08-15 NOTE — Op Note (Signed)
TOTAL KNEE REPLACEMENT OPERATIVE NOTE:  08/14/2013  11:03 AM  PATIENT:  Annette Tran  67 y.o. female  PRE-OPERATIVE DIAGNOSIS:  osteoarthritis left knee  POST-OPERATIVE DIAGNOSIS:  osteoarthritis left knee  PROCEDURE:  Procedure(s): LEFT TOTAL KNEE ARTHROPLASTY  SURGEON:  Surgeon(s): Dannielle Huh, MD  PHYSICIAN ASSISTANT: Altamese Cabal, [facility]  ANESTHESIA:   spinal  DRAINS: Hemovac and On-Q Marcaine Pain Pump  SPECIMEN: None  COUNTS:  Correct  TOURNIQUET:   Total Tourniquet Time Documented: Thigh (Left) - 50 minutes Total: Thigh (Left) - 50 minutes   DICTATION:  Indication for procedure:    The patient is a 67 y.o. female who has failed conservative treatment for osteoarthritis left knee.  Informed consent was obtained prior to anesthesia. The risks versus benefits of the operation were explain and in a way the patient can, and did, understand.   On the implant demand matching protocol, this patient scored 11.  Therefore, this patient was not receive a polyethylene insert with vitamin E which is a high demand implant.  Description of procedure:     The patient was taken to the operating room and placed under anesthesia.  The patient was positioned in the usual fashion taking care that all body parts were adequately padded and/or protected.  I foley catheter was placed.  A tourniquet was applied and the leg prepped and draped in the usual sterile fashion.  The extremity was exsanguinated with the esmarch and tourniquet inflated to 350 mmHg.  Pre-operative range of motion was normal.  The knee was in 5 degree of mild varus.  A midline incision approximately 6-7 inches long was made with a #10 blade.  A new blade was used to make a parapatellar arthrotomy going 2-3 cm into the quadriceps tendon, over the patella, and alongside the medial aspect of the patellar tendon.  A synovectomy was then performed with the #10 blade and forceps. I then elevated the deep MCL off the medial  tibial metaphysis subperiosteally around to the semimembranosus attachment.    I everted the patella and used calipers to measure patellar thickness.  I used the reamer to ream down to appropriate thickness to recreate the native thickness.  I then removed excess bone with the rongeur and sagittal saw.  I used the appropriately sized template and drilled the three lug holes.  I then put the trial in place and measured the thickness with the calipers to ensure recreation of the native thickness.  The trial was then removed and the patella subluxed and the knee brought into flexion.  A homan retractor was place to retract and protect the patella and lateral structures.  A Z-retractor was place medially to protect the medial structures.  The extra-medullary alignment system was used to make cut the tibial articular surface perpendicular to the anamotic axis of the tibia and in 3 degrees of posterior slope.  The cut surface and alignment jig was removed.  I then used the intramedullary alignment guide to make a 6 valgus cut on the distal femur.  I then marked out the epicondylar axis on the distal femur.  The posterior condylar axis measured 3 degrees.  I then used the anterior referencing sizer and measured the femur to be a size 5.  The 4-In-1 cutting block was screwed into place in external rotation matching the posterior condylar angle, making our cuts perpendicular to the epicondylar axis.  Anterior, posterior and chamfer cuts were made with the sagittal saw.  The cutting block and cut pieces  were removed.  A lamina spreader was placed in 90 degrees of flexion.  The ACL, PCL, menisci, and posterior condylar osteophytes were removed.  A 13 mm spacer blocked was found to offer good flexion and extension gap balance after minimal in degree releasing.   The scoop retractor was then placed and the femoral finishing block was pinned in place.  The small sagittal saw was used as well as the lug drill to finish the  femur.  The block and cut surfaces were removed and the medullary canal hole filled with autograft bone from the cut pieces.  The tibia was delivered forward in deep flexion and external rotation.  A size D tray was selected and pinned into place centered on the medial 1/3 of the tibial tubercle.  The reamer and keel was used to prepare the tibia through the tray.    I then trialed with the size 5 femur, size D tibia, a 13 mm insert and the 32 patella.  I had excellent flexion/extension gap balance, excellent patella tracking.  Flexion was full and beyond 120 degrees; extension was zero.  These components were chosen and the staff opened them to me on the back table while the knee was lavaged copiously and the cement mixed.  The soft tissue was infiltrated with 60cc of exparel 1.3% through a 21 gauge needle.  I cemented in the components and removed all excess cement.  The polyethylene tibial component was snapped into place and the knee placed in extension while cement was hardening.  The capsule was infilltrated with 30cc of .25% Marcaine with epinephrine.  A hemovac was place in the joint exiting superolaterally.  A pain pump was place superomedially superficial to the arthrotomy.  Once the cement was hard, the tourniquet was let down.  Hemostasis was obtained.  The arthrotomy was closed with figure-8 #1 vicryl sutures.  The deep soft tissues were closed with #0 vicryls and the subcuticular layer closed with a running #2-0 vicryl.  The skin was reapproximated and closed with skin staples.  The wound was dressed with xeroform, 4 x4's, 2 ABD sponges, a single layer of webril and a TED stocking.   The patient was then awakened, extubated, and taken to the recovery room in stable condition.  BLOOD LOSS:  300cc DRAINS: 1 hemovac, 1 pain catheter COMPLICATIONS:  None.  PLAN OF CARE: Admit to inpatient   PATIENT DISPOSITION:  PACU - hemodynamically stable.   Delay start of Pharmacological VTE agent  (>24hrs) due to surgical blood loss or risk of bleeding:  not applicable  Please fax a copy of this op note to my office at [phone] (please only include page 1 and 2 of the Case Information op note)

## 2013-08-15 NOTE — Progress Notes (Signed)
   CARE MANAGEMENT NOTE 08/15/2013  Patient:  Annette Tran, Annette Tran   Account Number:  [id number]  Date Initiated:  08/14/2013  Documentation initiated by:  [facility]  Subjective/Objective Assessment:   left  total knee arthroplasty     Action/Plan:   HH, waiting final PT/OT recommendations   Anticipated DC Date:  08/16/2013   Anticipated DC Plan:  HOME W HOME HEALTH SERVICES      DC Planning Services  CM consult      Choice offered to / List presented to:     DME arranged  CPM      DME agency  [employer]     HH arranged  HH-2 PT      [facility] agency  [facility]   Status of service:  Completed, signed off Medicare Important Message given?   (If response is "NO", the following Medicare IM given date fields will be blank) Date Medicare IM given:   Date Additional Medicare IM given:    Discharge Disposition:  HOME W HOME HEALTH SERVICES  Per UR Regulation:    If discussed at Long Length of Stay Meetings, dates discussed:    Comments:  08/15/2013 1150 NCM spoke to pt and her son will be flying in from [place] to assist her until Saturday. Pt has RW and bedside commode at home. [employer] will deliver CPM. Genevieve Norlander will arrange [facility] PT. Isidoro Donning RN CCM Case Mgmt phone [phone]

## 2013-08-15 NOTE — Progress Notes (Signed)
Physical Therapy Treatment Patient Details Name: Annette Tran MRN: [id number] DOB: [birth year] Today's Date: 08/15/2013 Time: 4098-1191 PT Time Calculation (min): 25 min  PT Assessment / Plan / Recommendation  History of Present Illness s/p LTKA   PT Comments   Pt cont's to progress with mobility.  Increased time ambulation distance & completed stair training.  Pt safe to d/c home from PT standpoint when MD feels medically ready.      Follow Up Recommendations  Home health PT;Supervision/Assistance - 24 hour     Does the patient have the potential to tolerate intense rehabilitation     Barriers to Discharge        Equipment Recommendations  Rolling walker with 5" wheels;3in1 (PT)    Recommendations for Other Services OT consult  Frequency 7X/week   Progress towards PT Goals Progress towards PT goals: Progressing toward goals  Plan Current plan remains appropriate    Precautions / Restrictions Precautions Precautions: Knee Restrictions LLE Weight Bearing: Weight bearing as tolerated   Pertinent Vitals/Pain 8/10 Lt knee.  Repositioned for comfort.     Mobility  Bed Mobility Bed Mobility: Not assessed Transfers Transfers: Sit to Stand;Stand to Sit Sit to Stand: With upper extremity assist;5: Supervision;With armrests;From chair/3-in-1 Stand to Sit: 5: Supervision;With upper extremity assist;With armrests;To chair/3-in-1 Details for Transfer Assistance: Supervision for safety Ambulation/Gait Ambulation/Gait Assistance: 4: Min guard Ambulation Distance (Feet): 120 Feet Assistive device: Rolling walker Ambulation/Gait Assistance Details: Guarding for safety due to pain & nausea.  Pt demonstrates proper sequencing & safe RW management Gait Pattern: Step-through pattern;Decreased step length - right;Decreased weight shift to right Stairs: Yes Stairs Assistance: 4: Min assist Stairs Assistance Details (indicate cue type and reason): (A) to stabilize RW.  Cues for  sequencing & RW positioning Stair Management Technique: No rails;Step to pattern;Backwards;With walker Number of Stairs: 2 Wheelchair Mobility Wheelchair Mobility: No    Exercises Total Joint Exercises Long Arc Quad: AROM;Strengthening;Left;10 reps Knee Flexion: AAROM;Left;10 reps;Seated Goniometric ROM: Lt knee flexion ~90 degrees in sitting     PT Goals (current goals can now be found in the care plan section) Acute Rehab PT Goals PT Goal Formulation: With patient Time For Goal Achievement: 08/21/13 Potential to Achieve Goals: Good  Visit Information  Last PT Received On: 08/15/13 Assistance Needed: +1 History of Present Illness: s/p LTKA    Subjective Data      Cognition  Cognition Arousal/Alertness: Awake/alert Behavior During Therapy: WFL for tasks assessed/performed Overall Cognitive Status: Within Functional Limits for tasks assessed    Balance     End of Session PT - End of Session Equipment Utilized During Treatment: Gait belt Activity Tolerance: Patient tolerated treatment well Patient left: in chair;with call bell/phone within reach;Other (comment) (set up for bath) Nurse Communication: Mobility status CPM Left Knee CPM Left Knee: On Left Knee Flexion (Degrees): 65   GP     Lara Mulch 08/15/2013, 1:40 PM   Verdell Face, PTA [phone] 08/15/2013

## 2013-08-16 ENCOUNTER — Encounter (HOSPITAL_COMMUNITY): Payer: Self-pay | Admitting: Orthopedic Surgery

## 2013-08-18 NOTE — Discharge Summary (Signed)
SPORTS MEDICINE & JOINT REPLACEMENT   Georgena Spurling, MD   Altamese Cabal, PA-C [address] [place], [place], [place]  [address]                             [phone]  PATIENT ID: Annette Tran        MRN:  [id number]          DOB/AGE: 07/03/1946 / 67 y.o.    DISCHARGE SUMMARY  ADMISSION DATE:    08/14/2013 DISCHARGE DATE:   08/15/2013  ADMISSION DIAGNOSIS: osteoarthritis left knee    DISCHARGE DIAGNOSIS:  osteoarthritis left knee    ADDITIONAL DIAGNOSIS: Active Problems:   * No active hospital problems. *  Past Medical History  Diagnosis Date  . PONV (postoperative nausea and vomiting)   . Neuromuscular disorder     abd tightness and numbness after c-section  . High cholesterol   . Asthma   . Multiple food allergies     "caffeine, onions, garlic, peppermint, chocolate" (08/14/2013)  . Seasonal allergies   . Shortness of breath     started 1 mth ago (read note)  . GERD (gastroesophageal reflux disease)   . Osteoarthritis     "all over" (08/14/2013)  . Depression     PROCEDURE: Procedure(s): LEFT TOTAL KNEE ARTHROPLASTY on 08/14/2013  CONSULTS:     HISTORY:  See H&P in chart  HOSPITAL COURSE:  Annette Tran is a 67 y.o. admitted on 08/14/2013 and found to have a diagnosis of osteoarthritis left knee.  After appropriate laboratory studies were obtained  they were taken to the operating room on 08/14/2013 and underwent Procedure(s): LEFT TOTAL KNEE ARTHROPLASTY.   They were given perioperative antibiotics:  Anti-infectives   Start     Dose/Rate Route Frequency Ordered Stop   08/14/13 1400  ceFAZolin (ANCEF) IVPB 1 g/50 mL premix     1 g 100 mL/hr over 30 Minutes Intravenous Every 6 hours 08/14/13 1123 08/14/13 2049   08/14/13 0600  ceFAZolin (ANCEF) IVPB 2 g/50 mL premix     2 g 100 mL/hr over 30 Minutes Intravenous On call to O.R. 08/13/13 1354 08/14/13 0745    .  Tolerated the procedure well.  Placed with a foley intraoperatively.  Given Ofirmev at  induction and for 48 hours.    POD# 1: Vital signs were stable.  Patient denied Chest pain, shortness of breath, or calf pain.  Patient was started on Lovenox 30 mg subcutaneously twice daily at 8am.  Consults to PT, OT, and care management were made.  The patient was weight bearing as tolerated.  CPM was placed on the operative leg 0-90 degrees for 6-8 hours a day.  Incentive spirometry was taught.  Dressing was changed.  Hemovac were discontinued.    Continued  PT for ambulation and exercise program.  IV saline locked.  O2 discontinued.    The remainder of the hospital course was dedicated to ambulation and strengthening.   The patient was discharged on 1 day post-op in  Good condition.  Blood products given:none  DIAGNOSTIC STUDIES: Recent vital signs: No data found.      Recent laboratory studies:  Recent Labs  08/14/13 1152 08/15/13 0500  WBC 9.7 9.2  HGB 12.3 11.3*  HCT 35.6* 33.0*  PLT 201 177    Recent Labs  08/14/13 1152 08/15/13 0500  NA  --  135  K  --  3.8  CL  --  101  CO2  --  27  BUN  --  7  CREATININE 0.60 0.57  GLUCOSE  --  110*  CALCIUM  --  8.7   Lab Results  Component Value Date   INR 0.93 08/04/2013   INR 0.95 11/08/2012   INR 0.92 10/20/2012     Recent Radiographic Studies :  No results found.  DISCHARGE INSTRUCTIONS: Discharge Orders   Future Orders Complete By Expires   Call MD / Call 911  As directed    Comments:     If you experience chest pain or shortness of breath, CALL 911 and be transported to the hospital emergency room.  If you develope a fever above 101 F, pus (white drainage) or increased drainage or redness at the wound, or calf pain, call your surgeon's office.   Change dressing  As directed    Comments:     Change dressing on wednesday, then change the dressing daily with sterile 4 x 4 inch gauze dressing and apply TED hose.   Constipation Prevention  As directed    Comments:     Drink plenty of fluids.  Prune juice may  be helpful.  You may use a stool softener, such as Colace (over the counter) 100 mg twice a day.  Use MiraLax (over the counter) for constipation as needed.   CPM  As directed    Comments:     Continuous passive motion machine (CPM):      Use the CPM from 0 to 90 for 6-8 hours per day.      You may increase by 10 per day.  You may break it up into 2 or 3 sessions per day.      Use CPM for 2 weeks or until you are told to stop.   Diet - low sodium heart healthy  As directed    Do not put a pillow under the knee. Place it under the heel.  As directed    Driving restrictions  As directed    Comments:     No driving for 6 weeks   Increase activity slowly as tolerated  As directed    Lifting restrictions  As directed    Comments:     No lifting for 6 weeks   TED hose  As directed    Comments:     Use stockings (TED hose) for 3 weeks on both leg(s).  You may remove them at night for sleeping.      DISCHARGE MEDICATIONS:     Medication List         albuterol 108 (90 BASE) MCG/ACT inhaler  Commonly known as:  PROVENTIL HFA;VENTOLIN HFA  Inhale 2 puffs into the lungs daily as needed for wheezing or shortness of breath (coughing).     albuterol (2.5 MG/3ML) 0.083% nebulizer solution  Commonly known as:  PROVENTIL  Take 2.5 mg by nebulization every 6 (six) hours as needed for wheezing or shortness of breath.     ALEVE 220 MG tablet  Generic drug:  naproxen sodium  Take 440 mg by mouth daily as needed (pain). For pain     dextromethorphan-guaiFENesin 30-600 MG per 12 hr tablet  Commonly known as:  MUCINEX DM  Take 1 tablet by mouth every 12 (twelve) hours as needed (cough/congestion).     enoxaparin 40 MG/0.4ML injection  Commonly known as:  LOVENOX  Inject 0.4 mLs (40 mg total) into the skin daily.     montelukast 10 MG tablet  Commonly known as:  SINGULAIR  Take 10 mg by mouth every morning.     oxyCODONE 5 MG immediate release tablet  Commonly known as:  Oxy IR/ROXICODONE   Take 1-2 tablets (5-10 mg total) by mouth every 3 (three) hours as needed.     OxyCODONE 10 mg T12a 12 hr tablet  Commonly known as:  OXYCONTIN  Take 1 tablet (10 mg total) by mouth every 12 (twelve) hours.     promethazine-codeine 6.25-10 MG/5ML syrup  Commonly known as:  PHENERGAN with CODEINE  Take 5 mLs by mouth at bedtime as needed for cough.        FOLLOW UP VISIT:       Follow-up Information   Follow up with [facility]. (Home Health Physical Therapy)    Contact information:   [phone]      Follow up with Raymon Mutton, MD. Call on 08/29/2013.   Specialty:  Orthopedic Surgery   Contact information:   [address] [place] [address] [phone]       DISPOSITION: HOME   CONDITION:  Good   Mort Smelser 08/18/2013, 8:43 AM

## 2013-09-13 IMAGING — CT CT ANGIO CHEST
2 of 6 series · 19 of 46 positions shown · IV contrast (APPLIED)
Comparison: Chest x-ray earlier today.

CLINICAL DATA: Chest pain.  Knee replacement on 10/24/2012

CT ANGIOGRAPHY CHEST
TECHNIQUE: Multidetector CT imaging of the chest using the
standard protocol during bolus administration of intravenous
contrast. Multiplanar reconstructed images including MIPs were
obtained and reviewed to evaluate the vascular anatomy.
Contrast:  80 ml Omnipaque 350

[Series 6: pulm embolism 1.0 b25f thin · axial · 0.70mm/px · z∈[+860,+1114]mm · 16 of 279 slices shown]
[im 13/279  lung]
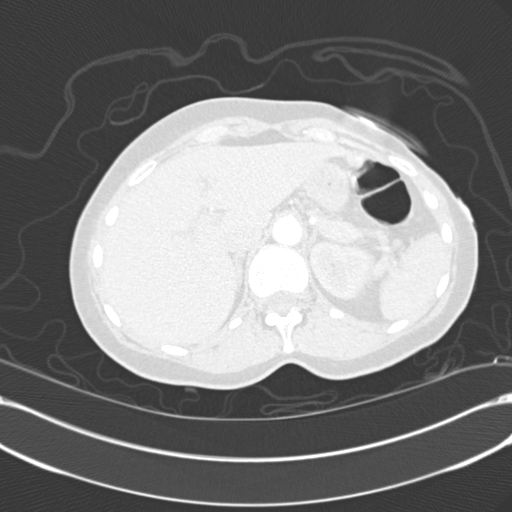
[im 37/279  soft-tissue]
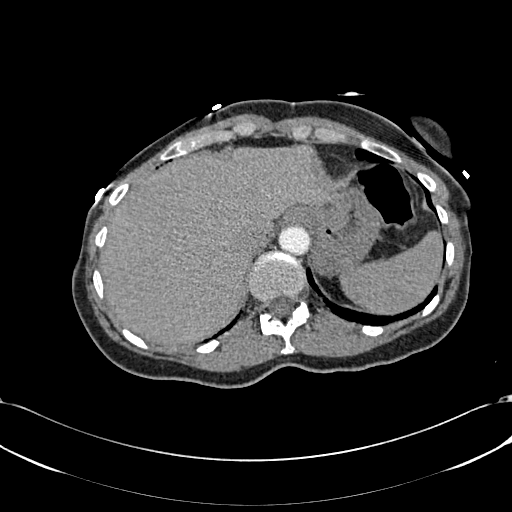
[im 49/279  lung]
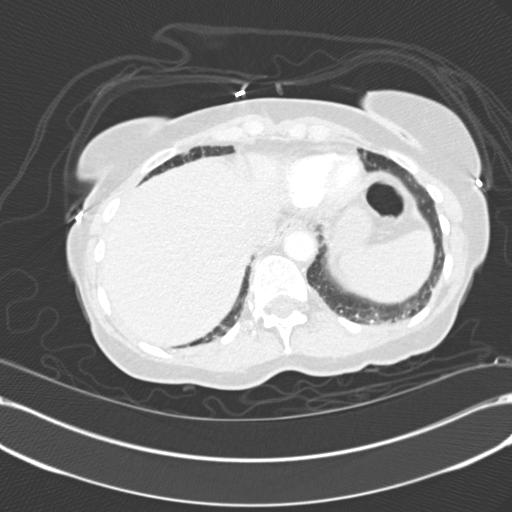
[im 61/279  soft-tissue]
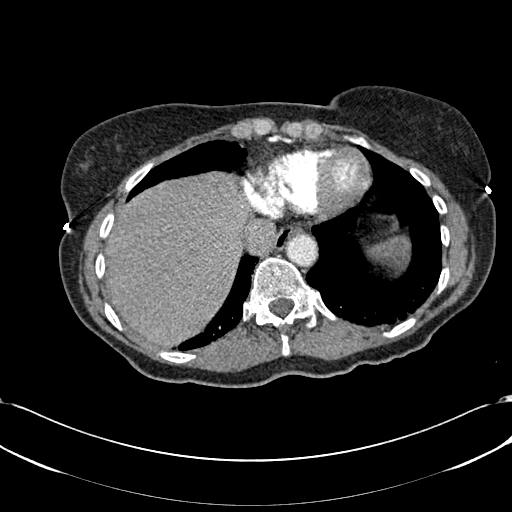
[im 85/279  lung]
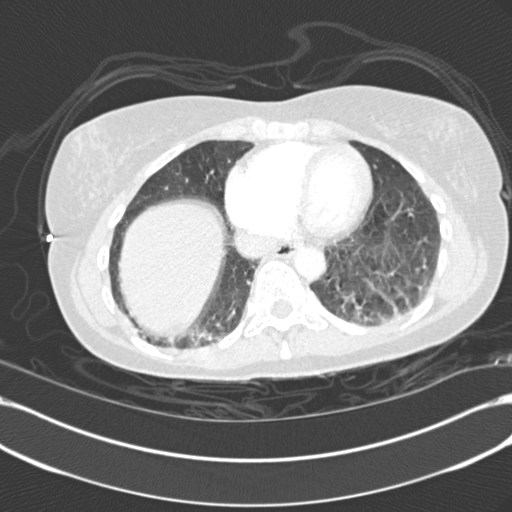
[im 97/279  soft-tissue]
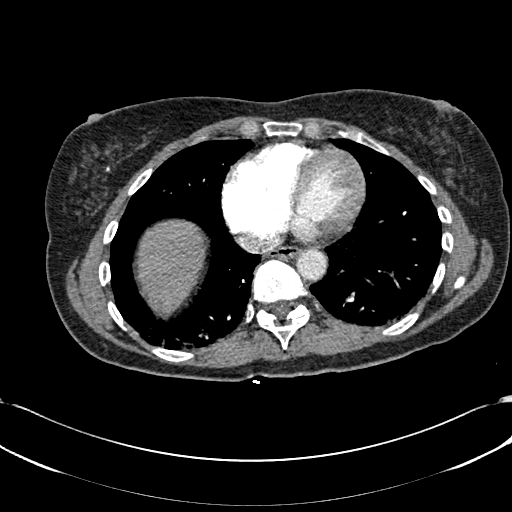
[im 109/279  lung]
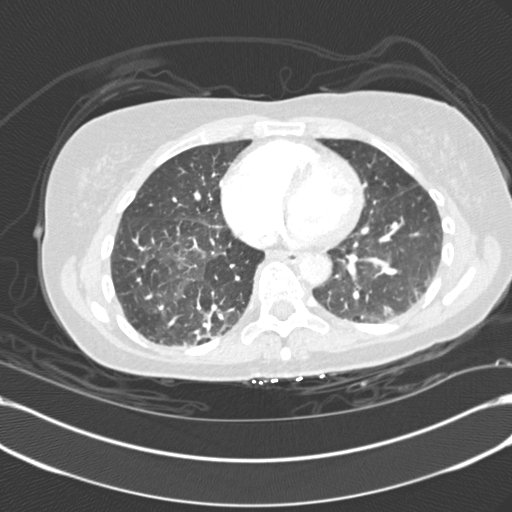
[im 133/279  soft-tissue]
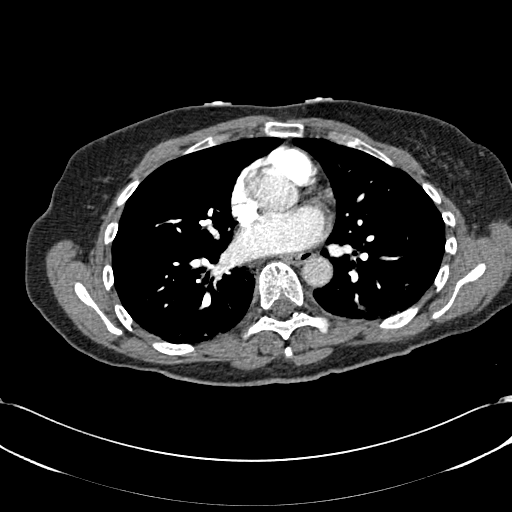
[im 146/279  lung]
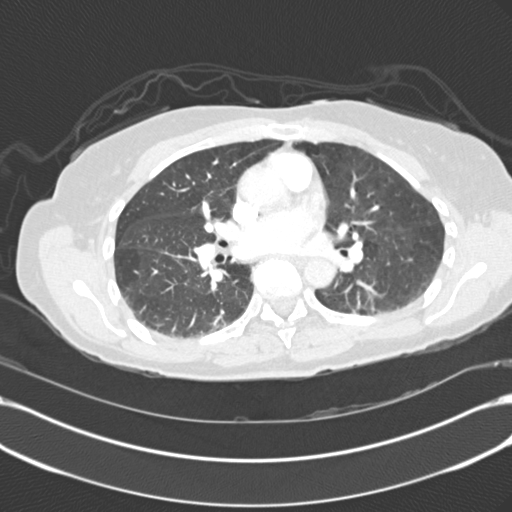
[im 170/279  soft-tissue]
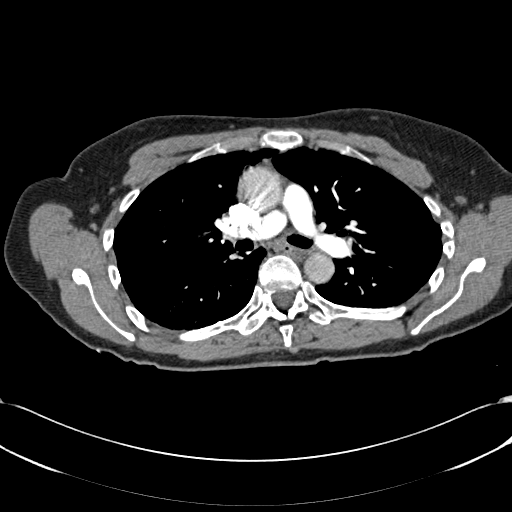
[im 182/279  lung]
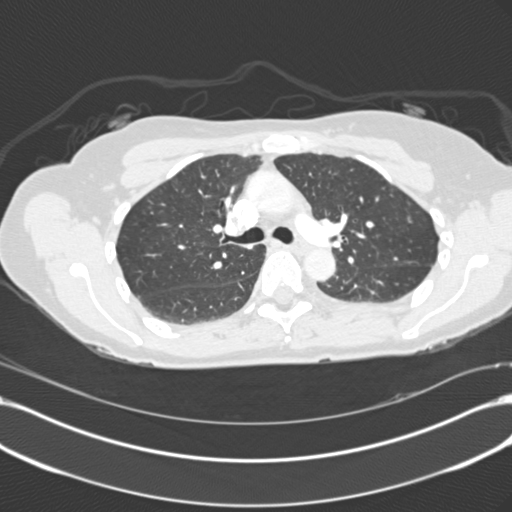
[im 194/279  soft-tissue]
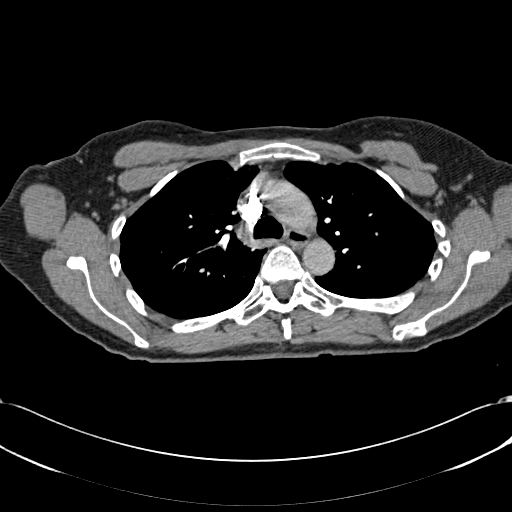
[im 218/279  lung]
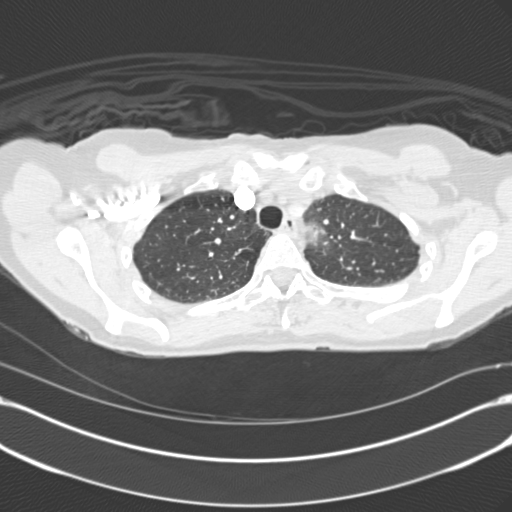
[im 230/279  soft-tissue]
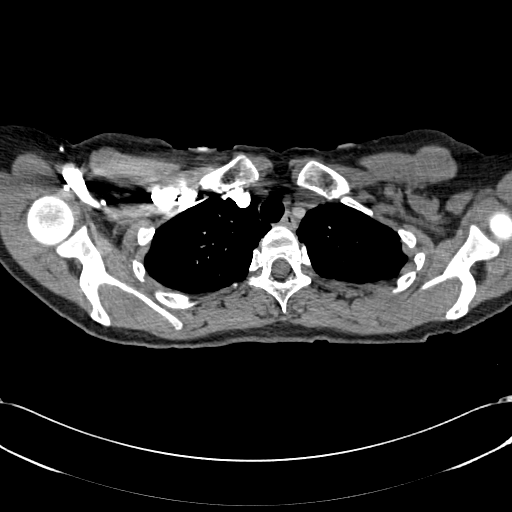
[im 242/279  lung]
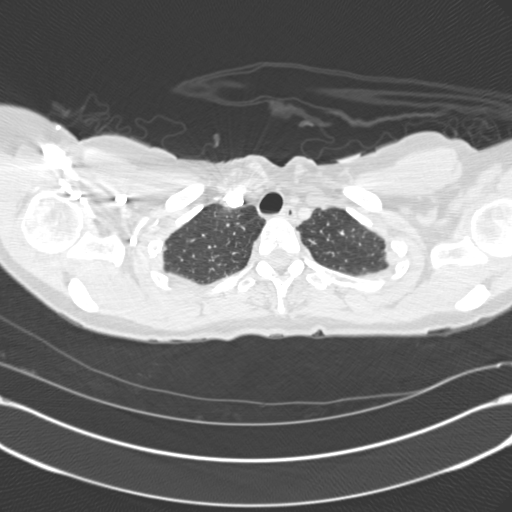
[im 266/279  soft-tissue]
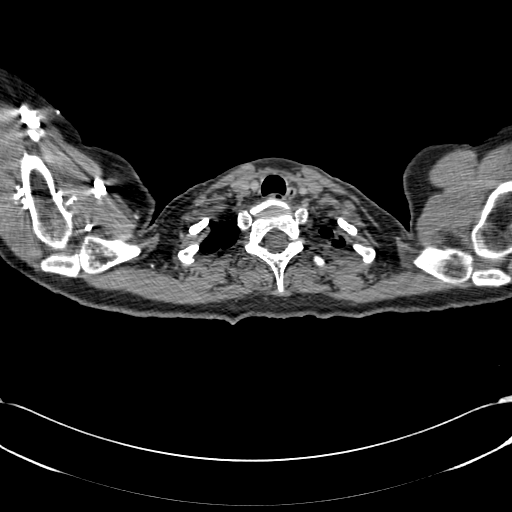

[Series 8: coronals · coronal · 0.57mm/px · 3 of 89 slices shown]
[im 23/89  soft-tissue]
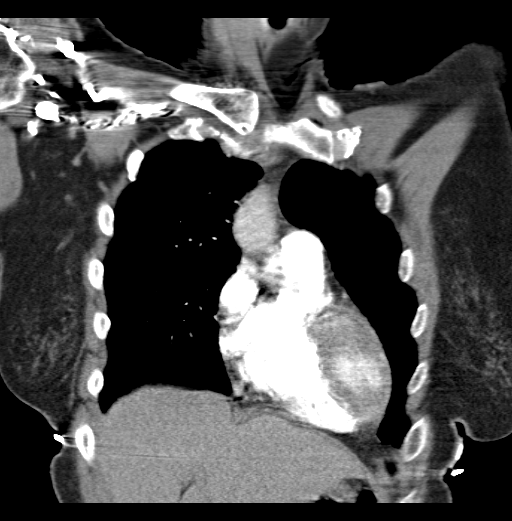
[im 45/89  soft-tissue]
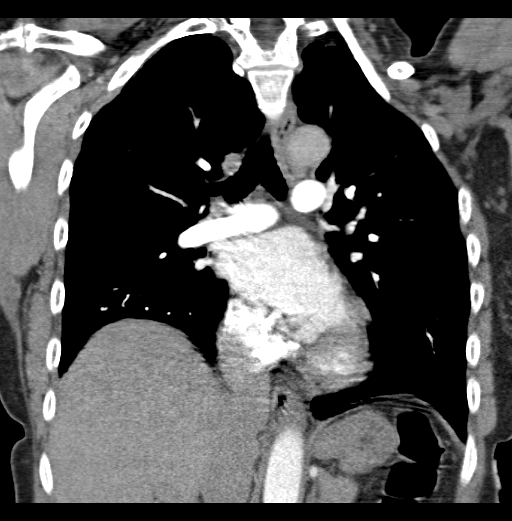
[im 67/89  soft-tissue]
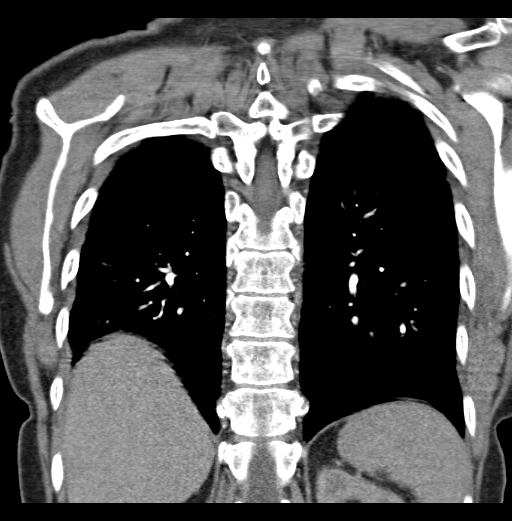

[19 of 46 positions shown; findings below may reference images not displayed]

FINDINGS: The lung fields are clear.  Other than minimal dependent
atelectasis, there is no infiltrate or effusion.  No pulmonary
nodules are seen.

There is good opacification of the pulmonary vasculature.  There is
no evidence for pulmonary emboli.

The heart is normal in size.  There is no coronary atherosclerosis.
The aorta shows a tiny bit of calcium in the transverse arch.

The central airways are patent.  No bronchial lesions are seen.

There is no axillary, supraclavicular, hilar or mediastinal
adenopathy.

Negative osseous structures.  Upper abdominal organs unremarkable.
IMPRESSION: Negative CTA chest. No evidence for pulmonary embolus or
postoperative pneumonia.

## 2015-08-29 DIAGNOSIS — Z23 Encounter for immunization: Secondary | ICD-10-CM | POA: Diagnosis not present

## 2016-03-18 DIAGNOSIS — Z6823 Body mass index (BMI) 23.0-23.9, adult: Secondary | ICD-10-CM | POA: Diagnosis not present

## 2016-03-18 DIAGNOSIS — E785 Hyperlipidemia, unspecified: Secondary | ICD-10-CM | POA: Diagnosis not present

## 2016-04-09 DIAGNOSIS — Z6823 Body mass index (BMI) 23.0-23.9, adult: Secondary | ICD-10-CM | POA: Diagnosis not present

## 2016-04-09 DIAGNOSIS — L237 Allergic contact dermatitis due to plants, except food: Secondary | ICD-10-CM | POA: Diagnosis not present

## 2016-06-20 DIAGNOSIS — R404 Transient alteration of awareness: Secondary | ICD-10-CM | POA: Diagnosis not present

## 2016-06-20 DIAGNOSIS — H8110 Benign paroxysmal vertigo, unspecified ear: Secondary | ICD-10-CM | POA: Diagnosis not present

## 2016-06-20 DIAGNOSIS — R42 Dizziness and giddiness: Secondary | ICD-10-CM | POA: Diagnosis not present

## 2016-06-20 DIAGNOSIS — R531 Weakness: Secondary | ICD-10-CM | POA: Diagnosis not present

## 2016-07-15 DIAGNOSIS — E785 Hyperlipidemia, unspecified: Secondary | ICD-10-CM | POA: Diagnosis not present

## 2016-07-15 DIAGNOSIS — Z23 Encounter for immunization: Secondary | ICD-10-CM | POA: Diagnosis not present

## 2016-07-15 DIAGNOSIS — Z9181 History of falling: Secondary | ICD-10-CM | POA: Diagnosis not present

## 2016-07-15 DIAGNOSIS — Z1389 Encounter for screening for other disorder: Secondary | ICD-10-CM | POA: Diagnosis not present

## 2016-07-15 DIAGNOSIS — H8113 Benign paroxysmal vertigo, bilateral: Secondary | ICD-10-CM | POA: Diagnosis not present

## 2016-07-15 DIAGNOSIS — J45909 Unspecified asthma, uncomplicated: Secondary | ICD-10-CM | POA: Diagnosis not present

## 2016-07-15 DIAGNOSIS — Z0000 Encounter for general adult medical examination without abnormal findings: Secondary | ICD-10-CM | POA: Diagnosis not present

## 2016-08-03 DIAGNOSIS — Z1231 Encounter for screening mammogram for malignant neoplasm of breast: Secondary | ICD-10-CM | POA: Diagnosis not present

## 2016-08-07 DIAGNOSIS — R69 Illness, unspecified: Secondary | ICD-10-CM | POA: Diagnosis not present

## 2016-08-10 DIAGNOSIS — Z111 Encounter for screening for respiratory tuberculosis: Secondary | ICD-10-CM | POA: Diagnosis not present

## 2016-09-22 DIAGNOSIS — Z6823 Body mass index (BMI) 23.0-23.9, adult: Secondary | ICD-10-CM | POA: Diagnosis not present

## 2016-09-22 DIAGNOSIS — M545 Low back pain: Secondary | ICD-10-CM | POA: Diagnosis not present

## 2017-01-14 DIAGNOSIS — J309 Allergic rhinitis, unspecified: Secondary | ICD-10-CM | POA: Diagnosis not present

## 2017-01-14 DIAGNOSIS — E785 Hyperlipidemia, unspecified: Secondary | ICD-10-CM | POA: Diagnosis not present

## 2017-07-12 DIAGNOSIS — Z136 Encounter for screening for cardiovascular disorders: Secondary | ICD-10-CM | POA: Diagnosis not present

## 2017-07-12 DIAGNOSIS — E785 Hyperlipidemia, unspecified: Secondary | ICD-10-CM | POA: Diagnosis not present

## 2017-07-12 DIAGNOSIS — Z9181 History of falling: Secondary | ICD-10-CM | POA: Diagnosis not present

## 2017-07-12 DIAGNOSIS — Z1389 Encounter for screening for other disorder: Secondary | ICD-10-CM | POA: Diagnosis not present

## 2017-07-12 DIAGNOSIS — Z1231 Encounter for screening mammogram for malignant neoplasm of breast: Secondary | ICD-10-CM | POA: Diagnosis not present

## 2017-07-12 DIAGNOSIS — N959 Unspecified menopausal and perimenopausal disorder: Secondary | ICD-10-CM | POA: Diagnosis not present

## 2017-07-12 DIAGNOSIS — Z0000 Encounter for general adult medical examination without abnormal findings: Secondary | ICD-10-CM | POA: Diagnosis not present

## 2017-07-15 DIAGNOSIS — H6122 Impacted cerumen, left ear: Secondary | ICD-10-CM | POA: Diagnosis not present

## 2017-07-15 DIAGNOSIS — H9202 Otalgia, left ear: Secondary | ICD-10-CM | POA: Diagnosis not present

## 2017-07-20 DIAGNOSIS — Z0000 Encounter for general adult medical examination without abnormal findings: Secondary | ICD-10-CM | POA: Diagnosis not present

## 2017-07-20 DIAGNOSIS — N898 Other specified noninflammatory disorders of vagina: Secondary | ICD-10-CM | POA: Diagnosis not present

## 2017-07-20 DIAGNOSIS — Z9181 History of falling: Secondary | ICD-10-CM | POA: Diagnosis not present

## 2017-07-20 DIAGNOSIS — E785 Hyperlipidemia, unspecified: Secondary | ICD-10-CM | POA: Diagnosis not present

## 2017-07-20 DIAGNOSIS — Z1389 Encounter for screening for other disorder: Secondary | ICD-10-CM | POA: Diagnosis not present

## 2017-07-26 DIAGNOSIS — H6123 Impacted cerumen, bilateral: Secondary | ICD-10-CM | POA: Diagnosis not present

## 2017-07-27 DIAGNOSIS — H6123 Impacted cerumen, bilateral: Secondary | ICD-10-CM | POA: Diagnosis not present

## 2017-08-09 DIAGNOSIS — N959 Unspecified menopausal and perimenopausal disorder: Secondary | ICD-10-CM | POA: Diagnosis not present

## 2017-08-09 DIAGNOSIS — Z1231 Encounter for screening mammogram for malignant neoplasm of breast: Secondary | ICD-10-CM | POA: Diagnosis not present

## 2017-08-09 DIAGNOSIS — M8589 Other specified disorders of bone density and structure, multiple sites: Secondary | ICD-10-CM | POA: Diagnosis not present

## 2017-08-09 DIAGNOSIS — M8580 Other specified disorders of bone density and structure, unspecified site: Secondary | ICD-10-CM | POA: Diagnosis not present

## 2017-08-16 DIAGNOSIS — E7800 Pure hypercholesterolemia, unspecified: Secondary | ICD-10-CM | POA: Insufficient documentation

## 2017-08-16 DIAGNOSIS — B373 Candidiasis of vulva and vagina: Secondary | ICD-10-CM | POA: Diagnosis not present

## 2017-08-16 DIAGNOSIS — R21 Rash and other nonspecific skin eruption: Secondary | ICD-10-CM | POA: Diagnosis not present

## 2017-08-16 HISTORY — DX: Pure hypercholesterolemia, unspecified: E78.00

## 2017-09-04 DIAGNOSIS — R69 Illness, unspecified: Secondary | ICD-10-CM | POA: Diagnosis not present

## 2017-09-13 DIAGNOSIS — L292 Pruritus vulvae: Secondary | ICD-10-CM | POA: Diagnosis not present

## 2017-09-13 DIAGNOSIS — B373 Candidiasis of vulva and vagina: Secondary | ICD-10-CM | POA: Diagnosis not present

## 2017-10-04 DIAGNOSIS — Z111 Encounter for screening for respiratory tuberculosis: Secondary | ICD-10-CM | POA: Diagnosis not present

## 2018-01-21 DIAGNOSIS — R197 Diarrhea, unspecified: Secondary | ICD-10-CM | POA: Diagnosis not present

## 2018-01-21 DIAGNOSIS — Z6824 Body mass index (BMI) 24.0-24.9, adult: Secondary | ICD-10-CM | POA: Diagnosis not present

## 2018-01-25 DIAGNOSIS — J309 Allergic rhinitis, unspecified: Secondary | ICD-10-CM | POA: Diagnosis not present

## 2018-01-25 DIAGNOSIS — Z6824 Body mass index (BMI) 24.0-24.9, adult: Secondary | ICD-10-CM | POA: Diagnosis not present

## 2018-01-25 DIAGNOSIS — M1811 Unilateral primary osteoarthritis of first carpometacarpal joint, right hand: Secondary | ICD-10-CM | POA: Diagnosis not present

## 2018-01-25 DIAGNOSIS — R197 Diarrhea, unspecified: Secondary | ICD-10-CM | POA: Diagnosis not present

## 2018-01-25 DIAGNOSIS — E785 Hyperlipidemia, unspecified: Secondary | ICD-10-CM | POA: Diagnosis not present

## 2018-01-26 DIAGNOSIS — R197 Diarrhea, unspecified: Secondary | ICD-10-CM | POA: Diagnosis not present

## 2018-07-18 DIAGNOSIS — Z6824 Body mass index (BMI) 24.0-24.9, adult: Secondary | ICD-10-CM | POA: Diagnosis not present

## 2018-07-18 DIAGNOSIS — Z9181 History of falling: Secondary | ICD-10-CM | POA: Diagnosis not present

## 2018-07-18 DIAGNOSIS — L237 Allergic contact dermatitis due to plants, except food: Secondary | ICD-10-CM | POA: Diagnosis not present

## 2018-07-18 DIAGNOSIS — N959 Unspecified menopausal and perimenopausal disorder: Secondary | ICD-10-CM | POA: Diagnosis not present

## 2018-07-18 DIAGNOSIS — Z1239 Encounter for other screening for malignant neoplasm of breast: Secondary | ICD-10-CM | POA: Diagnosis not present

## 2018-07-18 DIAGNOSIS — Z136 Encounter for screening for cardiovascular disorders: Secondary | ICD-10-CM | POA: Diagnosis not present

## 2018-07-18 DIAGNOSIS — Z1331 Encounter for screening for depression: Secondary | ICD-10-CM | POA: Diagnosis not present

## 2018-07-18 DIAGNOSIS — Z1339 Encounter for screening examination for other mental health and behavioral disorders: Secondary | ICD-10-CM | POA: Diagnosis not present

## 2018-07-18 DIAGNOSIS — Z0000 Encounter for general adult medical examination without abnormal findings: Secondary | ICD-10-CM | POA: Diagnosis not present

## 2018-07-18 DIAGNOSIS — E785 Hyperlipidemia, unspecified: Secondary | ICD-10-CM | POA: Diagnosis not present

## 2018-07-26 DIAGNOSIS — R69 Illness, unspecified: Secondary | ICD-10-CM | POA: Diagnosis not present

## 2018-08-08 DIAGNOSIS — H8113 Benign paroxysmal vertigo, bilateral: Secondary | ICD-10-CM | POA: Diagnosis not present

## 2018-08-08 DIAGNOSIS — R252 Cramp and spasm: Secondary | ICD-10-CM | POA: Diagnosis not present

## 2018-08-08 DIAGNOSIS — R609 Edema, unspecified: Secondary | ICD-10-CM | POA: Diagnosis not present

## 2018-08-08 DIAGNOSIS — E785 Hyperlipidemia, unspecified: Secondary | ICD-10-CM | POA: Diagnosis not present

## 2018-08-08 DIAGNOSIS — Z6824 Body mass index (BMI) 24.0-24.9, adult: Secondary | ICD-10-CM | POA: Diagnosis not present

## 2018-08-08 DIAGNOSIS — J309 Allergic rhinitis, unspecified: Secondary | ICD-10-CM | POA: Diagnosis not present

## 2018-08-08 DIAGNOSIS — L290 Pruritus ani: Secondary | ICD-10-CM | POA: Diagnosis not present

## 2018-08-08 DIAGNOSIS — Z1231 Encounter for screening mammogram for malignant neoplasm of breast: Secondary | ICD-10-CM | POA: Diagnosis not present

## 2018-09-19 DIAGNOSIS — Z1231 Encounter for screening mammogram for malignant neoplasm of breast: Secondary | ICD-10-CM | POA: Diagnosis not present

## 2018-10-24 DIAGNOSIS — Z6824 Body mass index (BMI) 24.0-24.9, adult: Secondary | ICD-10-CM | POA: Diagnosis not present

## 2018-10-24 DIAGNOSIS — N952 Postmenopausal atrophic vaginitis: Secondary | ICD-10-CM | POA: Diagnosis not present

## 2018-10-24 DIAGNOSIS — R300 Dysuria: Secondary | ICD-10-CM | POA: Diagnosis not present

## 2019-03-13 DIAGNOSIS — Z6825 Body mass index (BMI) 25.0-25.9, adult: Secondary | ICD-10-CM | POA: Diagnosis not present

## 2019-03-13 DIAGNOSIS — E785 Hyperlipidemia, unspecified: Secondary | ICD-10-CM | POA: Diagnosis not present

## 2019-03-13 DIAGNOSIS — H8113 Benign paroxysmal vertigo, bilateral: Secondary | ICD-10-CM | POA: Diagnosis not present

## 2019-03-13 DIAGNOSIS — R609 Edema, unspecified: Secondary | ICD-10-CM | POA: Diagnosis not present

## 2019-03-13 DIAGNOSIS — J309 Allergic rhinitis, unspecified: Secondary | ICD-10-CM | POA: Diagnosis not present

## 2019-03-13 DIAGNOSIS — R252 Cramp and spasm: Secondary | ICD-10-CM | POA: Diagnosis not present

## 2019-03-13 DIAGNOSIS — J4520 Mild intermittent asthma, uncomplicated: Secondary | ICD-10-CM | POA: Diagnosis not present

## 2019-05-26 DIAGNOSIS — R69 Illness, unspecified: Secondary | ICD-10-CM | POA: Diagnosis not present

## 2019-07-14 DIAGNOSIS — R69 Illness, unspecified: Secondary | ICD-10-CM | POA: Diagnosis not present

## 2019-07-20 DIAGNOSIS — Z139 Encounter for screening, unspecified: Secondary | ICD-10-CM | POA: Diagnosis not present

## 2019-07-20 DIAGNOSIS — Z0000 Encounter for general adult medical examination without abnormal findings: Secondary | ICD-10-CM | POA: Diagnosis not present

## 2019-07-20 DIAGNOSIS — Z1231 Encounter for screening mammogram for malignant neoplasm of breast: Secondary | ICD-10-CM | POA: Diagnosis not present

## 2019-07-20 DIAGNOSIS — Z9181 History of falling: Secondary | ICD-10-CM | POA: Diagnosis not present

## 2019-07-20 DIAGNOSIS — E785 Hyperlipidemia, unspecified: Secondary | ICD-10-CM | POA: Diagnosis not present

## 2019-07-20 DIAGNOSIS — N959 Unspecified menopausal and perimenopausal disorder: Secondary | ICD-10-CM | POA: Diagnosis not present

## 2019-07-20 DIAGNOSIS — Z1331 Encounter for screening for depression: Secondary | ICD-10-CM | POA: Diagnosis not present

## 2019-08-10 DIAGNOSIS — H40003 Preglaucoma, unspecified, bilateral: Secondary | ICD-10-CM | POA: Diagnosis not present

## 2019-08-10 DIAGNOSIS — H35362 Drusen (degenerative) of macula, left eye: Secondary | ICD-10-CM | POA: Diagnosis not present

## 2019-08-10 DIAGNOSIS — H25813 Combined forms of age-related cataract, bilateral: Secondary | ICD-10-CM | POA: Diagnosis not present

## 2019-08-24 DIAGNOSIS — H40003 Preglaucoma, unspecified, bilateral: Secondary | ICD-10-CM | POA: Diagnosis not present

## 2019-08-26 DIAGNOSIS — H8113 Benign paroxysmal vertigo, bilateral: Secondary | ICD-10-CM | POA: Diagnosis not present

## 2019-08-26 DIAGNOSIS — H6123 Impacted cerumen, bilateral: Secondary | ICD-10-CM | POA: Diagnosis not present

## 2019-08-26 DIAGNOSIS — R0789 Other chest pain: Secondary | ICD-10-CM | POA: Diagnosis not present

## 2019-08-26 DIAGNOSIS — R42 Dizziness and giddiness: Secondary | ICD-10-CM | POA: Diagnosis not present

## 2019-08-26 DIAGNOSIS — W19XXXA Unspecified fall, initial encounter: Secondary | ICD-10-CM | POA: Diagnosis not present

## 2019-08-26 DIAGNOSIS — H8110 Benign paroxysmal vertigo, unspecified ear: Secondary | ICD-10-CM | POA: Diagnosis not present

## 2019-08-31 DIAGNOSIS — R69 Illness, unspecified: Secondary | ICD-10-CM | POA: Diagnosis not present

## 2019-08-31 DIAGNOSIS — E538 Deficiency of other specified B group vitamins: Secondary | ICD-10-CM | POA: Diagnosis not present

## 2019-08-31 DIAGNOSIS — Z6825 Body mass index (BMI) 25.0-25.9, adult: Secondary | ICD-10-CM | POA: Diagnosis not present

## 2019-08-31 DIAGNOSIS — R42 Dizziness and giddiness: Secondary | ICD-10-CM | POA: Diagnosis not present

## 2019-08-31 DIAGNOSIS — H8113 Benign paroxysmal vertigo, bilateral: Secondary | ICD-10-CM | POA: Diagnosis not present

## 2019-09-08 DIAGNOSIS — R42 Dizziness and giddiness: Secondary | ICD-10-CM | POA: Diagnosis not present

## 2019-09-08 DIAGNOSIS — H8110 Benign paroxysmal vertigo, unspecified ear: Secondary | ICD-10-CM | POA: Diagnosis not present

## 2019-09-08 DIAGNOSIS — R2681 Unsteadiness on feet: Secondary | ICD-10-CM | POA: Diagnosis not present

## 2019-09-12 DIAGNOSIS — E785 Hyperlipidemia, unspecified: Secondary | ICD-10-CM | POA: Diagnosis not present

## 2019-09-12 DIAGNOSIS — Z6825 Body mass index (BMI) 25.0-25.9, adult: Secondary | ICD-10-CM | POA: Diagnosis not present

## 2019-09-12 DIAGNOSIS — J4520 Mild intermittent asthma, uncomplicated: Secondary | ICD-10-CM | POA: Diagnosis not present

## 2019-09-12 DIAGNOSIS — H8113 Benign paroxysmal vertigo, bilateral: Secondary | ICD-10-CM | POA: Diagnosis not present

## 2019-09-12 DIAGNOSIS — J309 Allergic rhinitis, unspecified: Secondary | ICD-10-CM | POA: Diagnosis not present

## 2019-09-12 DIAGNOSIS — R609 Edema, unspecified: Secondary | ICD-10-CM | POA: Diagnosis not present

## 2019-09-13 DIAGNOSIS — R42 Dizziness and giddiness: Secondary | ICD-10-CM | POA: Diagnosis not present

## 2019-09-13 DIAGNOSIS — H8110 Benign paroxysmal vertigo, unspecified ear: Secondary | ICD-10-CM | POA: Diagnosis not present

## 2019-09-13 DIAGNOSIS — R2681 Unsteadiness on feet: Secondary | ICD-10-CM | POA: Diagnosis not present

## 2019-09-20 DIAGNOSIS — H8110 Benign paroxysmal vertigo, unspecified ear: Secondary | ICD-10-CM | POA: Diagnosis not present

## 2019-09-20 DIAGNOSIS — R2681 Unsteadiness on feet: Secondary | ICD-10-CM | POA: Diagnosis not present

## 2019-09-20 DIAGNOSIS — R42 Dizziness and giddiness: Secondary | ICD-10-CM | POA: Diagnosis not present

## 2019-10-20 DIAGNOSIS — Z20828 Contact with and (suspected) exposure to other viral communicable diseases: Secondary | ICD-10-CM | POA: Diagnosis not present

## 2019-10-23 DIAGNOSIS — Z6826 Body mass index (BMI) 26.0-26.9, adult: Secondary | ICD-10-CM | POA: Diagnosis not present

## 2019-10-23 DIAGNOSIS — S39012A Strain of muscle, fascia and tendon of lower back, initial encounter: Secondary | ICD-10-CM | POA: Diagnosis not present

## 2019-11-15 DIAGNOSIS — R69 Illness, unspecified: Secondary | ICD-10-CM | POA: Diagnosis not present

## 2020-03-01 DIAGNOSIS — H269 Unspecified cataract: Secondary | ICD-10-CM | POA: Diagnosis not present

## 2020-03-01 DIAGNOSIS — Z008 Encounter for other general examination: Secondary | ICD-10-CM | POA: Diagnosis not present

## 2020-03-01 DIAGNOSIS — E785 Hyperlipidemia, unspecified: Secondary | ICD-10-CM | POA: Diagnosis not present

## 2020-03-01 DIAGNOSIS — J309 Allergic rhinitis, unspecified: Secondary | ICD-10-CM | POA: Diagnosis not present

## 2020-03-01 DIAGNOSIS — R030 Elevated blood-pressure reading, without diagnosis of hypertension: Secondary | ICD-10-CM | POA: Diagnosis not present

## 2020-03-01 DIAGNOSIS — M792 Neuralgia and neuritis, unspecified: Secondary | ICD-10-CM | POA: Diagnosis not present

## 2020-03-01 DIAGNOSIS — R32 Unspecified urinary incontinence: Secondary | ICD-10-CM | POA: Diagnosis not present

## 2020-03-01 DIAGNOSIS — M62838 Other muscle spasm: Secondary | ICD-10-CM | POA: Diagnosis not present

## 2020-03-01 DIAGNOSIS — M1990 Unspecified osteoarthritis, unspecified site: Secondary | ICD-10-CM | POA: Diagnosis not present

## 2020-03-01 DIAGNOSIS — R69 Illness, unspecified: Secondary | ICD-10-CM | POA: Diagnosis not present

## 2020-03-01 DIAGNOSIS — G8929 Other chronic pain: Secondary | ICD-10-CM | POA: Diagnosis not present

## 2020-03-11 DIAGNOSIS — R42 Dizziness and giddiness: Secondary | ICD-10-CM | POA: Diagnosis not present

## 2020-03-11 DIAGNOSIS — Z6825 Body mass index (BMI) 25.0-25.9, adult: Secondary | ICD-10-CM | POA: Diagnosis not present

## 2020-03-11 DIAGNOSIS — J4520 Mild intermittent asthma, uncomplicated: Secondary | ICD-10-CM | POA: Diagnosis not present

## 2020-03-11 DIAGNOSIS — H8113 Benign paroxysmal vertigo, bilateral: Secondary | ICD-10-CM | POA: Diagnosis not present

## 2020-03-11 DIAGNOSIS — J309 Allergic rhinitis, unspecified: Secondary | ICD-10-CM | POA: Diagnosis not present

## 2020-03-11 DIAGNOSIS — E785 Hyperlipidemia, unspecified: Secondary | ICD-10-CM | POA: Diagnosis not present

## 2020-03-11 DIAGNOSIS — R609 Edema, unspecified: Secondary | ICD-10-CM | POA: Diagnosis not present

## 2020-03-11 DIAGNOSIS — M8589 Other specified disorders of bone density and structure, multiple sites: Secondary | ICD-10-CM | POA: Diagnosis not present

## 2020-03-11 DIAGNOSIS — Z1213 Encounter for screening for malignant neoplasm of small intestine: Secondary | ICD-10-CM | POA: Diagnosis not present

## 2020-03-11 DIAGNOSIS — Z1211 Encounter for screening for malignant neoplasm of colon: Secondary | ICD-10-CM | POA: Diagnosis not present

## 2020-03-15 DIAGNOSIS — R69 Illness, unspecified: Secondary | ICD-10-CM | POA: Diagnosis not present

## 2020-03-27 DIAGNOSIS — I6523 Occlusion and stenosis of bilateral carotid arteries: Secondary | ICD-10-CM | POA: Diagnosis not present

## 2020-03-27 DIAGNOSIS — R42 Dizziness and giddiness: Secondary | ICD-10-CM | POA: Diagnosis not present

## 2020-04-03 DIAGNOSIS — R2681 Unsteadiness on feet: Secondary | ICD-10-CM | POA: Insufficient documentation

## 2020-04-03 DIAGNOSIS — R200 Anesthesia of skin: Secondary | ICD-10-CM | POA: Diagnosis not present

## 2020-04-03 DIAGNOSIS — J42 Unspecified chronic bronchitis: Secondary | ICD-10-CM

## 2020-04-03 DIAGNOSIS — R42 Dizziness and giddiness: Secondary | ICD-10-CM

## 2020-04-03 DIAGNOSIS — H269 Unspecified cataract: Secondary | ICD-10-CM

## 2020-04-03 HISTORY — DX: Dizziness and giddiness: R42

## 2020-04-03 HISTORY — DX: Unspecified cataract: H26.9

## 2020-04-03 HISTORY — DX: Unsteadiness on feet: R26.81

## 2020-04-03 HISTORY — DX: Unspecified chronic bronchitis: J42

## 2020-04-11 DIAGNOSIS — R69 Illness, unspecified: Secondary | ICD-10-CM | POA: Diagnosis not present

## 2020-04-15 DIAGNOSIS — Z1231 Encounter for screening mammogram for malignant neoplasm of breast: Secondary | ICD-10-CM | POA: Diagnosis not present

## 2020-04-15 DIAGNOSIS — M85852 Other specified disorders of bone density and structure, left thigh: Secondary | ICD-10-CM | POA: Diagnosis not present

## 2020-04-15 DIAGNOSIS — M8589 Other specified disorders of bone density and structure, multiple sites: Secondary | ICD-10-CM | POA: Diagnosis not present

## 2020-04-18 DIAGNOSIS — R69 Illness, unspecified: Secondary | ICD-10-CM | POA: Diagnosis not present

## 2020-04-25 DIAGNOSIS — R2681 Unsteadiness on feet: Secondary | ICD-10-CM | POA: Diagnosis not present

## 2020-04-25 DIAGNOSIS — R42 Dizziness and giddiness: Secondary | ICD-10-CM | POA: Diagnosis not present

## 2020-04-25 DIAGNOSIS — R270 Ataxia, unspecified: Secondary | ICD-10-CM | POA: Diagnosis not present

## 2020-05-01 DIAGNOSIS — R200 Anesthesia of skin: Secondary | ICD-10-CM | POA: Diagnosis not present

## 2020-05-01 DIAGNOSIS — R42 Dizziness and giddiness: Secondary | ICD-10-CM | POA: Diagnosis not present

## 2020-05-01 DIAGNOSIS — R2681 Unsteadiness on feet: Secondary | ICD-10-CM | POA: Diagnosis not present

## 2020-05-01 DIAGNOSIS — D519 Vitamin B12 deficiency anemia, unspecified: Secondary | ICD-10-CM | POA: Diagnosis not present

## 2020-05-13 DIAGNOSIS — K6289 Other specified diseases of anus and rectum: Secondary | ICD-10-CM | POA: Diagnosis not present

## 2020-05-13 DIAGNOSIS — Z01818 Encounter for other preprocedural examination: Secondary | ICD-10-CM | POA: Diagnosis not present

## 2020-05-14 DIAGNOSIS — R69 Illness, unspecified: Secondary | ICD-10-CM | POA: Diagnosis not present

## 2020-05-23 DIAGNOSIS — Z1159 Encounter for screening for other viral diseases: Secondary | ICD-10-CM | POA: Diagnosis not present

## 2020-05-23 DIAGNOSIS — Z20828 Contact with and (suspected) exposure to other viral communicable diseases: Secondary | ICD-10-CM | POA: Diagnosis not present

## 2020-05-30 DIAGNOSIS — Z803 Family history of malignant neoplasm of breast: Secondary | ICD-10-CM | POA: Diagnosis not present

## 2020-05-30 DIAGNOSIS — K6289 Other specified diseases of anus and rectum: Secondary | ICD-10-CM | POA: Diagnosis not present

## 2020-05-30 DIAGNOSIS — Z90710 Acquired absence of both cervix and uterus: Secondary | ICD-10-CM | POA: Diagnosis not present

## 2020-05-30 DIAGNOSIS — Z79899 Other long term (current) drug therapy: Secondary | ICD-10-CM | POA: Diagnosis not present

## 2020-05-30 DIAGNOSIS — Z1211 Encounter for screening for malignant neoplasm of colon: Secondary | ICD-10-CM | POA: Diagnosis not present

## 2020-05-30 DIAGNOSIS — M1990 Unspecified osteoarthritis, unspecified site: Secondary | ICD-10-CM | POA: Diagnosis not present

## 2020-05-30 DIAGNOSIS — L988 Other specified disorders of the skin and subcutaneous tissue: Secondary | ICD-10-CM | POA: Diagnosis not present

## 2020-06-17 DIAGNOSIS — R69 Illness, unspecified: Secondary | ICD-10-CM | POA: Diagnosis not present

## 2020-08-12 DIAGNOSIS — R69 Illness, unspecified: Secondary | ICD-10-CM | POA: Diagnosis not present

## 2020-08-28 DIAGNOSIS — Z20828 Contact with and (suspected) exposure to other viral communicable diseases: Secondary | ICD-10-CM | POA: Diagnosis not present

## 2021-01-23 DIAGNOSIS — M533 Sacrococcygeal disorders, not elsewhere classified: Secondary | ICD-10-CM | POA: Diagnosis not present

## 2021-01-23 DIAGNOSIS — M545 Low back pain, unspecified: Secondary | ICD-10-CM | POA: Diagnosis not present

## 2021-04-01 DIAGNOSIS — Z1159 Encounter for screening for other viral diseases: Secondary | ICD-10-CM | POA: Diagnosis not present

## 2021-04-01 DIAGNOSIS — Z20828 Contact with and (suspected) exposure to other viral communicable diseases: Secondary | ICD-10-CM | POA: Diagnosis not present

## 2021-04-16 DIAGNOSIS — E785 Hyperlipidemia, unspecified: Secondary | ICD-10-CM | POA: Diagnosis not present

## 2021-04-16 DIAGNOSIS — Z9181 History of falling: Secondary | ICD-10-CM | POA: Diagnosis not present

## 2021-04-16 DIAGNOSIS — Z1331 Encounter for screening for depression: Secondary | ICD-10-CM | POA: Diagnosis not present

## 2021-04-16 DIAGNOSIS — Z0000 Encounter for general adult medical examination without abnormal findings: Secondary | ICD-10-CM | POA: Diagnosis not present

## 2021-04-16 DIAGNOSIS — Z139 Encounter for screening, unspecified: Secondary | ICD-10-CM | POA: Diagnosis not present

## 2021-04-24 DIAGNOSIS — H8113 Benign paroxysmal vertigo, bilateral: Secondary | ICD-10-CM | POA: Diagnosis not present

## 2021-04-24 DIAGNOSIS — R609 Edema, unspecified: Secondary | ICD-10-CM | POA: Diagnosis not present

## 2021-04-24 DIAGNOSIS — E538 Deficiency of other specified B group vitamins: Secondary | ICD-10-CM | POA: Diagnosis not present

## 2021-04-24 DIAGNOSIS — E785 Hyperlipidemia, unspecified: Secondary | ICD-10-CM | POA: Diagnosis not present

## 2021-04-24 DIAGNOSIS — Z6824 Body mass index (BMI) 24.0-24.9, adult: Secondary | ICD-10-CM | POA: Diagnosis not present

## 2021-04-24 DIAGNOSIS — R042 Hemoptysis: Secondary | ICD-10-CM | POA: Diagnosis not present

## 2021-04-24 DIAGNOSIS — R053 Chronic cough: Secondary | ICD-10-CM | POA: Diagnosis not present

## 2021-04-24 DIAGNOSIS — J4520 Mild intermittent asthma, uncomplicated: Secondary | ICD-10-CM | POA: Diagnosis not present

## 2021-05-13 DIAGNOSIS — R042 Hemoptysis: Secondary | ICD-10-CM | POA: Diagnosis not present

## 2021-05-13 DIAGNOSIS — J4530 Mild persistent asthma, uncomplicated: Secondary | ICD-10-CM | POA: Diagnosis not present

## 2021-05-13 DIAGNOSIS — J84112 Idiopathic pulmonary fibrosis: Secondary | ICD-10-CM | POA: Diagnosis not present

## 2021-05-20 DIAGNOSIS — R911 Solitary pulmonary nodule: Secondary | ICD-10-CM | POA: Diagnosis not present

## 2021-05-20 DIAGNOSIS — J479 Bronchiectasis, uncomplicated: Secondary | ICD-10-CM | POA: Diagnosis not present

## 2021-05-20 DIAGNOSIS — R042 Hemoptysis: Secondary | ICD-10-CM | POA: Diagnosis not present

## 2021-05-20 DIAGNOSIS — I700 Atherosclerosis of aorta: Secondary | ICD-10-CM | POA: Diagnosis not present

## 2021-05-20 DIAGNOSIS — J849 Interstitial pulmonary disease, unspecified: Secondary | ICD-10-CM | POA: Diagnosis not present

## 2021-05-20 DIAGNOSIS — J84112 Idiopathic pulmonary fibrosis: Secondary | ICD-10-CM | POA: Diagnosis not present

## 2021-05-20 DIAGNOSIS — R918 Other nonspecific abnormal finding of lung field: Secondary | ICD-10-CM | POA: Diagnosis not present

## 2021-05-22 DIAGNOSIS — W57XXXA Bitten or stung by nonvenomous insect and other nonvenomous arthropods, initial encounter: Secondary | ICD-10-CM | POA: Diagnosis not present

## 2021-05-22 DIAGNOSIS — S50862A Insect bite (nonvenomous) of left forearm, initial encounter: Secondary | ICD-10-CM | POA: Diagnosis not present

## 2021-05-22 DIAGNOSIS — L03114 Cellulitis of left upper limb: Secondary | ICD-10-CM | POA: Diagnosis not present

## 2021-05-22 DIAGNOSIS — Z79899 Other long term (current) drug therapy: Secondary | ICD-10-CM | POA: Diagnosis not present

## 2021-05-22 DIAGNOSIS — Z7952 Long term (current) use of systemic steroids: Secondary | ICD-10-CM | POA: Diagnosis not present

## 2021-06-10 DIAGNOSIS — R070 Pain in throat: Secondary | ICD-10-CM | POA: Diagnosis not present

## 2021-06-10 DIAGNOSIS — Z20828 Contact with and (suspected) exposure to other viral communicable diseases: Secondary | ICD-10-CM | POA: Diagnosis not present

## 2021-06-10 DIAGNOSIS — R519 Headache, unspecified: Secondary | ICD-10-CM | POA: Diagnosis not present

## 2021-06-16 DIAGNOSIS — Z20828 Contact with and (suspected) exposure to other viral communicable diseases: Secondary | ICD-10-CM | POA: Diagnosis not present

## 2021-06-16 DIAGNOSIS — R0981 Nasal congestion: Secondary | ICD-10-CM | POA: Diagnosis not present

## 2021-07-01 DIAGNOSIS — R911 Solitary pulmonary nodule: Secondary | ICD-10-CM | POA: Diagnosis not present

## 2021-07-01 DIAGNOSIS — J84112 Idiopathic pulmonary fibrosis: Secondary | ICD-10-CM | POA: Diagnosis not present

## 2021-07-01 DIAGNOSIS — J4530 Mild persistent asthma, uncomplicated: Secondary | ICD-10-CM | POA: Diagnosis not present

## 2021-07-01 DIAGNOSIS — Z8616 Personal history of COVID-19: Secondary | ICD-10-CM | POA: Diagnosis not present

## 2021-07-30 DIAGNOSIS — J4530 Mild persistent asthma, uncomplicated: Secondary | ICD-10-CM | POA: Diagnosis not present

## 2021-07-30 DIAGNOSIS — R911 Solitary pulmonary nodule: Secondary | ICD-10-CM | POA: Diagnosis not present

## 2021-07-30 DIAGNOSIS — Z8616 Personal history of COVID-19: Secondary | ICD-10-CM | POA: Diagnosis not present

## 2021-07-30 DIAGNOSIS — J84112 Idiopathic pulmonary fibrosis: Secondary | ICD-10-CM | POA: Diagnosis not present

## 2021-08-19 DIAGNOSIS — G2581 Restless legs syndrome: Secondary | ICD-10-CM | POA: Diagnosis not present

## 2021-08-19 DIAGNOSIS — R252 Cramp and spasm: Secondary | ICD-10-CM | POA: Diagnosis not present

## 2021-10-22 DIAGNOSIS — J4530 Mild persistent asthma, uncomplicated: Secondary | ICD-10-CM | POA: Diagnosis not present

## 2021-10-22 DIAGNOSIS — R911 Solitary pulmonary nodule: Secondary | ICD-10-CM | POA: Diagnosis not present

## 2021-10-22 DIAGNOSIS — J84112 Idiopathic pulmonary fibrosis: Secondary | ICD-10-CM | POA: Diagnosis not present

## 2021-10-22 DIAGNOSIS — Z8616 Personal history of COVID-19: Secondary | ICD-10-CM | POA: Diagnosis not present

## 2021-11-04 DIAGNOSIS — J84112 Idiopathic pulmonary fibrosis: Secondary | ICD-10-CM | POA: Diagnosis not present

## 2021-11-04 DIAGNOSIS — R911 Solitary pulmonary nodule: Secondary | ICD-10-CM | POA: Diagnosis not present

## 2021-11-10 DIAGNOSIS — E785 Hyperlipidemia, unspecified: Secondary | ICD-10-CM | POA: Diagnosis not present

## 2021-11-10 DIAGNOSIS — E538 Deficiency of other specified B group vitamins: Secondary | ICD-10-CM | POA: Diagnosis not present

## 2021-11-10 DIAGNOSIS — H8113 Benign paroxysmal vertigo, bilateral: Secondary | ICD-10-CM | POA: Diagnosis not present

## 2021-11-10 DIAGNOSIS — Z6825 Body mass index (BMI) 25.0-25.9, adult: Secondary | ICD-10-CM | POA: Diagnosis not present

## 2021-11-10 DIAGNOSIS — R609 Edema, unspecified: Secondary | ICD-10-CM | POA: Diagnosis not present

## 2021-11-10 DIAGNOSIS — J8410 Pulmonary fibrosis, unspecified: Secondary | ICD-10-CM | POA: Diagnosis not present

## 2021-11-10 DIAGNOSIS — J4520 Mild intermittent asthma, uncomplicated: Secondary | ICD-10-CM | POA: Diagnosis not present

## 2021-11-21 DIAGNOSIS — R911 Solitary pulmonary nodule: Secondary | ICD-10-CM | POA: Diagnosis not present

## 2021-11-21 DIAGNOSIS — J84112 Idiopathic pulmonary fibrosis: Secondary | ICD-10-CM | POA: Diagnosis not present

## 2021-11-21 DIAGNOSIS — Z8616 Personal history of COVID-19: Secondary | ICD-10-CM | POA: Diagnosis not present

## 2021-11-21 DIAGNOSIS — J4530 Mild persistent asthma, uncomplicated: Secondary | ICD-10-CM | POA: Diagnosis not present

## 2021-12-05 DIAGNOSIS — H9202 Otalgia, left ear: Secondary | ICD-10-CM | POA: Diagnosis not present

## 2021-12-05 DIAGNOSIS — H6122 Impacted cerumen, left ear: Secondary | ICD-10-CM | POA: Diagnosis not present

## 2022-02-13 DIAGNOSIS — R911 Solitary pulmonary nodule: Secondary | ICD-10-CM | POA: Diagnosis not present

## 2022-02-13 DIAGNOSIS — Z8616 Personal history of COVID-19: Secondary | ICD-10-CM | POA: Diagnosis not present

## 2022-02-13 DIAGNOSIS — J4530 Mild persistent asthma, uncomplicated: Secondary | ICD-10-CM | POA: Diagnosis not present

## 2022-02-13 DIAGNOSIS — J84112 Idiopathic pulmonary fibrosis: Secondary | ICD-10-CM | POA: Diagnosis not present

## 2022-02-19 DIAGNOSIS — J4520 Mild intermittent asthma, uncomplicated: Secondary | ICD-10-CM | POA: Diagnosis not present

## 2022-02-19 DIAGNOSIS — J208 Acute bronchitis due to other specified organisms: Secondary | ICD-10-CM | POA: Diagnosis not present

## 2022-02-19 DIAGNOSIS — B9689 Other specified bacterial agents as the cause of diseases classified elsewhere: Secondary | ICD-10-CM | POA: Diagnosis not present

## 2022-04-29 DIAGNOSIS — R0602 Shortness of breath: Secondary | ICD-10-CM | POA: Diagnosis not present

## 2022-05-06 DIAGNOSIS — Z8616 Personal history of COVID-19: Secondary | ICD-10-CM | POA: Diagnosis not present

## 2022-05-06 DIAGNOSIS — J84112 Idiopathic pulmonary fibrosis: Secondary | ICD-10-CM | POA: Diagnosis not present

## 2022-05-06 DIAGNOSIS — J4530 Mild persistent asthma, uncomplicated: Secondary | ICD-10-CM | POA: Diagnosis not present

## 2022-05-06 DIAGNOSIS — R911 Solitary pulmonary nodule: Secondary | ICD-10-CM | POA: Diagnosis not present

## 2022-05-12 ENCOUNTER — Ambulatory Visit (INDEPENDENT_AMBULATORY_CARE_PROVIDER_SITE_OTHER): Payer: Medicare HMO

## 2022-05-12 ENCOUNTER — Other Ambulatory Visit: Payer: Self-pay

## 2022-05-12 DIAGNOSIS — R079 Chest pain, unspecified: Secondary | ICD-10-CM

## 2022-05-12 DIAGNOSIS — I471 Supraventricular tachycardia: Secondary | ICD-10-CM | POA: Diagnosis not present

## 2022-05-15 DIAGNOSIS — H8113 Benign paroxysmal vertigo, bilateral: Secondary | ICD-10-CM | POA: Diagnosis not present

## 2022-05-15 DIAGNOSIS — J8410 Pulmonary fibrosis, unspecified: Secondary | ICD-10-CM | POA: Diagnosis not present

## 2022-05-15 DIAGNOSIS — E785 Hyperlipidemia, unspecified: Secondary | ICD-10-CM | POA: Diagnosis not present

## 2022-05-15 DIAGNOSIS — M545 Low back pain, unspecified: Secondary | ICD-10-CM | POA: Diagnosis not present

## 2022-05-15 DIAGNOSIS — E538 Deficiency of other specified B group vitamins: Secondary | ICD-10-CM | POA: Diagnosis not present

## 2022-05-15 DIAGNOSIS — R609 Edema, unspecified: Secondary | ICD-10-CM | POA: Diagnosis not present

## 2022-05-15 DIAGNOSIS — G8929 Other chronic pain: Secondary | ICD-10-CM | POA: Diagnosis not present

## 2022-05-15 DIAGNOSIS — J4520 Mild intermittent asthma, uncomplicated: Secondary | ICD-10-CM | POA: Diagnosis not present

## 2022-05-20 ENCOUNTER — Ambulatory Visit: Payer: Medicare HMO

## 2022-05-20 ENCOUNTER — Telehealth: Payer: Self-pay

## 2022-05-20 ENCOUNTER — Encounter: Payer: Self-pay | Admitting: Cardiology

## 2022-05-20 DIAGNOSIS — R002 Palpitations: Secondary | ICD-10-CM | POA: Diagnosis not present

## 2022-05-20 DIAGNOSIS — R55 Syncope and collapse: Secondary | ICD-10-CM | POA: Diagnosis not present

## 2022-05-20 DIAGNOSIS — Z743 Need for continuous supervision: Secondary | ICD-10-CM | POA: Diagnosis not present

## 2022-05-20 DIAGNOSIS — R42 Dizziness and giddiness: Secondary | ICD-10-CM | POA: Diagnosis not present

## 2022-05-20 NOTE — Progress Notes (Signed)
Received a phone call from [facility] ED she presented after an episode of palpitation near syncope no abnormality found in the emergency room including telemetry monitoring EKG labs I advised that they remove her event monitor drop in the mail tonight so we can review at the first opportunity.

## 2022-05-20 NOTE — Telephone Encounter (Signed)
Patient came to the office because our office was closer and she did not think she could make it to her PCP office. She reported that she was feeling light headed and dizzy and she also felt like she was going to pass out. She was already wearing a zio heart monitor that we had applied 1 week ago. When we got her back to the exam room she started having chest pressure and indigestion. Based on these symptoms I recommended that we call EMS since she felt as if she was going to pass out and it was not safe if she drove herself to the ER. The patient was in agreement with this plan. EMS was called and they responded and took the patient to the ER.

## 2022-05-24 DIAGNOSIS — X58XXXA Exposure to other specified factors, initial encounter: Secondary | ICD-10-CM | POA: Diagnosis not present

## 2022-05-24 DIAGNOSIS — T7840XA Allergy, unspecified, initial encounter: Secondary | ICD-10-CM | POA: Diagnosis not present

## 2022-05-24 DIAGNOSIS — S20461A Insect bite (nonvenomous) of right back wall of thorax, initial encounter: Secondary | ICD-10-CM | POA: Diagnosis not present

## 2022-05-27 DIAGNOSIS — R079 Chest pain, unspecified: Secondary | ICD-10-CM | POA: Diagnosis not present

## 2022-05-29 ENCOUNTER — Telehealth: Payer: Self-pay | Admitting: Cardiology

## 2022-05-29 NOTE — Telephone Encounter (Signed)
Patient coming in to follow up on monitor results. Would like to know if Dr. Dulce Sellar has reviewed them as he had asked her not to drive and she has been compliant but it is very difficult and would like to know her results.  Thank you!  [phone]

## 2022-05-29 NOTE — Telephone Encounter (Signed)
Left message for the patient to call back.

## 2022-06-01 NOTE — Telephone Encounter (Signed)
Patient came by the office to get her results. Annette Tran spoke to the patient and gave her the results of her monitor and Dr. Hulen Shouts recommendations.

## 2022-06-03 DIAGNOSIS — Z8616 Personal history of COVID-19: Secondary | ICD-10-CM | POA: Diagnosis not present

## 2022-06-03 DIAGNOSIS — J84112 Idiopathic pulmonary fibrosis: Secondary | ICD-10-CM | POA: Diagnosis not present

## 2022-06-03 DIAGNOSIS — J4530 Mild persistent asthma, uncomplicated: Secondary | ICD-10-CM | POA: Diagnosis not present

## 2022-06-03 DIAGNOSIS — R42 Dizziness and giddiness: Secondary | ICD-10-CM | POA: Diagnosis not present

## 2022-06-03 DIAGNOSIS — R911 Solitary pulmonary nodule: Secondary | ICD-10-CM | POA: Diagnosis not present

## 2022-06-17 ENCOUNTER — Encounter: Payer: Self-pay | Admitting: *Deleted

## 2022-06-17 ENCOUNTER — Encounter: Payer: Self-pay | Admitting: Cardiology

## 2022-07-13 DIAGNOSIS — Z91018 Allergy to other foods: Secondary | ICD-10-CM | POA: Insufficient documentation

## 2022-07-13 DIAGNOSIS — J45909 Unspecified asthma, uncomplicated: Secondary | ICD-10-CM | POA: Insufficient documentation

## 2022-07-13 DIAGNOSIS — J4520 Mild intermittent asthma, uncomplicated: Secondary | ICD-10-CM | POA: Diagnosis not present

## 2022-07-13 DIAGNOSIS — R0602 Shortness of breath: Secondary | ICD-10-CM | POA: Insufficient documentation

## 2022-07-13 DIAGNOSIS — F32A Depression, unspecified: Secondary | ICD-10-CM | POA: Insufficient documentation

## 2022-07-13 DIAGNOSIS — R911 Solitary pulmonary nodule: Secondary | ICD-10-CM | POA: Diagnosis not present

## 2022-07-13 DIAGNOSIS — Z8616 Personal history of COVID-19: Secondary | ICD-10-CM | POA: Diagnosis not present

## 2022-07-13 DIAGNOSIS — J302 Other seasonal allergic rhinitis: Secondary | ICD-10-CM | POA: Insufficient documentation

## 2022-07-13 DIAGNOSIS — R42 Dizziness and giddiness: Secondary | ICD-10-CM | POA: Diagnosis not present

## 2022-07-13 DIAGNOSIS — J84112 Idiopathic pulmonary fibrosis: Secondary | ICD-10-CM | POA: Diagnosis not present

## 2022-07-13 DIAGNOSIS — K219 Gastro-esophageal reflux disease without esophagitis: Secondary | ICD-10-CM | POA: Insufficient documentation

## 2022-07-14 NOTE — Progress Notes (Unsigned)
Cardiology Office Note:    Date:  07/15/2022   ID:  Annette Tran, DOB 1946/04/04, MRN [id number]  PCP:  Paulina Fusi, MD  Cardiologist:  Norman Herrlich, MD   Referring MD: Paulina Fusi, MD  ASSESSMENT:    1. SVT (supraventricular tachycardia) (HCC)   2. Pulmonary fibrosis (HCC)   3. Asthma, unspecified asthma severity, unspecified whether complicated, unspecified whether persistent    PLAN:    In order of problems listed above:  Her clinical problem is quite symptomatic SVT I think beta-blocker is a poor choice because of the potential worsening her bronchospasm we will place her on calcium channel blocker I think she will get good symptomatic relief I gave her instructions to avoid over-the-counter proarrhythmic drugs. I think she can continue her treatment for pulmonary fibrosis Takes a statin and should continue the same for hyperlipidemia  Next appointment 6 months   Medication Adjustments/Labs and Tests Ordered: Current medicines are reviewed at length with the patient today.  Concerns regarding medicines are outlined above.  No orders of the defined types were placed in this encounter.  Meds ordered this encounter  Medications   diltiazem (CARDIZEM CD) 120 MG 24 hr capsule    Sig: Take 1 capsule (120 mg total) by mouth daily.    Dispense:  90 capsule    Refill:  3     Chief Complaint  Patient presents with   Follow-up    Her event monitor showed the presence of SVT    History of Present Illness:    Annette Tran is a 76 y.o. female who is being seen today for the evaluation of palpitation and near syncope at the request of Paulina Fusi, MD.  She has a history of dizziness vertigo was seen by [facility] neurology June 2021  She is seen by pulmonary with a history of pulmonary fibrosis previous COVID infection and asthma. That office did an echocardiogram 04/30/2022 cannot be independently reviewed conclusion normal left ventricular  size wall thickness systolic function grade 1 diastolic dysfunction normal right ventricular size and function and trivial tricuspid regurgitation trivial mitral regurgitation normal pulmonary artery pressure.  She was wearing an event monitor when she went to the emergency room it showed the presence of rare ventricular ectopy rare supraventricular ectopy no episodes of bradycardia sinus arrest or second or third-degree AV block.  There were frequent episodes of SVT longest episode 35 seconds rate of 110 bpm.  Triggered events were all associated with sinus rhythm and one of them with brief atrial tachycardia.  She was seen at [facility] ED 05/20/2022 EKG independently reviewed showed sinus rhythm best described as incomplete right bundle branch block left atrial abnormality sinus rhythm CT of the chest showed pulmonary fibrosis in January  She was initially referred by pulmonary in [place] for SVT She is having episodes of her heart racing near loss of consciousness and her monitor confirmed symptomatic SVT no atrial fibrillation Rate simply her symptoms have abated No chest pain syncope she has pulmonary fibrosis chronic cough.  Her perception is she has a severe lung problem she also has asthma. She is a retired Engineer, civil (consulting) we discussed treatment strategies I think she do best with a calcium channel blocker I will start her on a low-dose of sustained-release Cardizem which I think should give her good symptomatic relief and be well-tolerated She anticipates travel to [place] for prolonged period of time to be with her daughter I encouraged her to get her  COVID vaccine prior to travel and to wear a K N95 mask airport and plane Past Medical History:  Diagnosis Date   Arthritis    Asthma    Depression    GERD (gastroesophageal reflux disease)    High cholesterol    Hypercholesterolemia 08/16/2017   Multiple food allergies    "caffeine, onions, garlic, peppermint, chocolate" (08/14/2013)    Neuromuscular disorder (HCC)    abd tightness and numbness after c-section   Osteoarthritis    "all over" (08/14/2013)   Osteoarthritis of left knee 08/03/2013   PONV (postoperative nausea and vomiting)    Seasonal allergies    Shortness of breath    started 1 mth ago (read note)   Unsteady gait 04/03/2020   Vertigo 04/03/2020    Past Surgical History:  Procedure Laterality Date   [place]; 1973   "miscarriages" (08/14/2013)   GROIN MASS OPEN BIOPSY Right 1974   "had a mammary gland excised from here" (08/14/2013)   [place]  10/24/2012   Procedure: TOTAL KNEE ARTHROPLASTY;  Surgeon: Vickey Huger, MD;  Location: [facility];  Service: Orthopedics;  Laterality: Right;  right total knee arthroplasty   TOTAL KNEE ARTHROPLASTY Left 08/14/2013   TOTAL KNEE ARTHROPLASTY Left 08/14/2013   Procedure: LEFT TOTAL KNEE ARTHROPLASTY;  Surgeon: Vickey Huger, MD;  Location: [facility];  Service: Orthopedics;  Laterality: Left;    Current Medications: Current Meds  Medication Sig   atorvastatin (LIPITOR) 10 MG tablet Take 10 mg by mouth daily.   diltiazem (CARDIZEM CD) 120 MG 24 hr capsule Take 1 capsule (120 mg total) by mouth daily.   EPINEPHrine 0.3 MG/0.3ML SOSY Inject 0.3 mg as directed as needed (allergic reaction).   fluticasone furoate-vilanterol (BREO ELLIPTA) 100-25 MCG/ACT AEPB Inhale 1 puff into the lungs daily.   furosemide (LASIX) 20 MG tablet Take 20 mg by mouth daily as needed for fluid or edema.   meclizine (ANTIVERT) 25 MG tablet Take 25 mg by mouth 3 (three) times daily as needed for dizziness.   Pirfenidone (ESBRIET) 267 MG CAPS Take 267 mg by mouth daily.   rOPINIRole (REQUIP) 1 MG tablet Take 1 mg by mouth at bedtime.   SYMBICORT 160-4.5 MCG/ACT inhaler Inhale 2 puffs into the lungs 2 (two) times daily.     Allergies:   Demerol  [meperidine], Other, Shellfish allergy, and Doxycycline   Social History   Socioeconomic History   Marital status: Divorced    Spouse name: Not on file   Number of children: Not on file   Years of education: Not on file   Highest education level: Not on file  Occupational History   Not on file  Tobacco Use   Smoking status: Never    Passive exposure: Past   Smokeless tobacco: Never  Vaping Use   Vaping Use: Never used  Substance and Sexual Activity   Alcohol use: No   Drug use: No   Sexual activity: Never  Other Topics Concern   Not on file  Social History Narrative   Not on file   Social Determinants of Health   Financial Resource Strain: Not on file  Food Insecurity: Not on file  Transportation Needs: Not on file  Physical Activity: Not on file  Stress: Not on file  Social Connections: Not on file  Family History: The patient's family history includes Breast cancer in her mother; Heart disease in her father; Tuberculosis in her mother.  ROS:   ROS Please see the history of present illness.     All other systems reviewed and are negative.  EKGs/Labs/Other Studies Reviewed:    The following studies were reviewed today:  See history  Physical Exam:    VS:  BP 120/60 (BP Location: Right Arm, Patient Position: Sitting)   Pulse 70   Ht 5\' 4"  (1.626 m)   Wt 137 lb (62.1 kg)   SpO2 96%   BMI 23.52 kg/m     Wt Readings from Last 3 Encounters:  07/15/22 137 lb (62.1 kg)  06/03/22 138 lb (62.6 kg)  08/04/13 140 lb 3 oz (63.6 kg)     GEN: She does not look chronically ill well nourished, well developed in no acute distress HEENT: Normal NECK: No JVD; No carotid bruits LYMPHATICS: No lymphadenopathy CARDIAC: RRR, no murmurs, rubs, gallops RESPIRATORY:  Clear to auscultation without rales, wheezing or rhonchi  ABDOMEN: Soft, non-tender, non-distended MUSCULOSKELETAL:  No edema; No deformity  SKIN: Warm and dry NEUROLOGIC:  Alert and oriented x  3 PSYCHIATRIC:  Normal affect     Signed, 10/04/13, MD  07/15/2022 9:03 AM    [facility]

## 2022-07-15 ENCOUNTER — Ambulatory Visit: Payer: Medicare HMO | Attending: Cardiology | Admitting: Cardiology

## 2022-07-15 ENCOUNTER — Encounter: Payer: Self-pay | Admitting: Cardiology

## 2022-07-15 VITALS — BP 120/60 | HR 70 | Ht 64.0 in | Wt 137.0 lb

## 2022-07-15 DIAGNOSIS — J45909 Unspecified asthma, uncomplicated: Secondary | ICD-10-CM | POA: Diagnosis not present

## 2022-07-15 DIAGNOSIS — E7800 Pure hypercholesterolemia, unspecified: Secondary | ICD-10-CM

## 2022-07-15 DIAGNOSIS — J8410 Pulmonary fibrosis, unspecified: Secondary | ICD-10-CM

## 2022-07-15 DIAGNOSIS — I471 Supraventricular tachycardia: Secondary | ICD-10-CM

## 2022-07-15 MED ORDER — DILTIAZEM HCL ER COATED BEADS 120 MG PO CP24: 120.0000 mg | ORAL_CAPSULE | Freq: Every day | ORAL | 3 refills | Status: DC

## 2022-07-15 NOTE — Patient Instructions (Addendum)
Medication Instructions:  Your physician has recommended you make the following change in your medication:   START: Cardizem CD 120 mg daily  *If you need a refill on your cardiac medications before your next appointment, please call your pharmacy*   Lab Work: None If you have labs (blood work) drawn today and your tests are completely normal, you will receive your results only by: MyChart Message (if you have MyChart) OR A paper copy in the mail If you have any lab test that is abnormal or we need to change your treatment, we will call you to review the results.   Testing/Procedures: None   Follow-Up: At [facility], you and your health needs are our priority.  As part of our continuing mission to provide you with exceptional heart care, we have created designated Provider Care Teams.  These Care Teams include your primary Cardiologist (physician) and Advanced Practice Providers (APPs -  Physician Assistants and Nurse Practitioners) who all work together to provide you with the care you need, when you need it.  We recommend signing up for the patient portal called "MyChart".  Sign up information is provided on this After Visit Summary.  MyChart is used to connect with patients for Virtual Visits (Telemedicine).  Patients are able to view lab/test results, encounter notes, upcoming appointments, etc.  Non-urgent messages can be sent to your provider as well.   To learn more about what you can do with MyChart, go to [email or web address].    Your next appointment:   6 month(s)  The format for your next appointment:   In Person  Provider:   Norman Herrlich, MD    Other Instructions Purchase an Apple Watch  Important Information About Sugar        1. Avoid all over-the-counter antihistamines except Claritin/Loratadine and Zyrtec/Cetrizine. 2. Avoid all combination including cold sinus allergies flu decongestant and sleep medications 3. You can use Robitussin DM  Mucinex and Mucinex DM for cough. 4. can use Tylenol aspirin ibuprofen and naproxen but no combinations such as sleep or sinus.

## 2022-07-25 DIAGNOSIS — R0981 Nasal congestion: Secondary | ICD-10-CM | POA: Diagnosis not present

## 2022-07-25 DIAGNOSIS — M791 Myalgia, unspecified site: Secondary | ICD-10-CM | POA: Diagnosis not present

## 2022-07-28 DIAGNOSIS — B974 Respiratory syncytial virus as the cause of diseases classified elsewhere: Secondary | ICD-10-CM | POA: Diagnosis not present

## 2022-07-28 DIAGNOSIS — Z03818 Encounter for observation for suspected exposure to other biological agents ruled out: Secondary | ICD-10-CM | POA: Diagnosis not present

## 2022-07-28 DIAGNOSIS — Z8616 Personal history of COVID-19: Secondary | ICD-10-CM | POA: Diagnosis not present

## 2022-07-28 DIAGNOSIS — Z20828 Contact with and (suspected) exposure to other viral communicable diseases: Secondary | ICD-10-CM | POA: Diagnosis not present

## 2022-08-02 DIAGNOSIS — R10812 Left upper quadrant abdominal tenderness: Secondary | ICD-10-CM | POA: Diagnosis not present

## 2022-08-02 DIAGNOSIS — R109 Unspecified abdominal pain: Secondary | ICD-10-CM | POA: Diagnosis not present

## 2022-08-02 DIAGNOSIS — W01198A Fall on same level from slipping, tripping and stumbling with subsequent striking against other object, initial encounter: Secondary | ICD-10-CM | POA: Diagnosis not present

## 2022-08-02 DIAGNOSIS — S301XXA Contusion of abdominal wall, initial encounter: Secondary | ICD-10-CM | POA: Diagnosis not present

## 2022-08-02 DIAGNOSIS — Z90710 Acquired absence of both cervix and uterus: Secondary | ICD-10-CM | POA: Diagnosis not present

## 2022-08-02 DIAGNOSIS — S299XXA Unspecified injury of thorax, initial encounter: Secondary | ICD-10-CM | POA: Diagnosis not present

## 2022-08-02 DIAGNOSIS — S20212A Contusion of left front wall of thorax, initial encounter: Secondary | ICD-10-CM | POA: Diagnosis not present

## 2022-08-02 DIAGNOSIS — Y9301 Activity, walking, marching and hiking: Secondary | ICD-10-CM | POA: Diagnosis not present

## 2022-08-02 DIAGNOSIS — W0110XA Fall on same level from slipping, tripping and stumbling with subsequent striking against unspecified object, initial encounter: Secondary | ICD-10-CM | POA: Diagnosis not present

## 2022-08-02 DIAGNOSIS — R079 Chest pain, unspecified: Secondary | ICD-10-CM | POA: Diagnosis not present

## 2022-08-02 DIAGNOSIS — Z96653 Presence of artificial knee joint, bilateral: Secondary | ICD-10-CM | POA: Diagnosis not present

## 2022-08-22 DIAGNOSIS — N3000 Acute cystitis without hematuria: Secondary | ICD-10-CM | POA: Diagnosis not present

## 2022-08-22 DIAGNOSIS — J209 Acute bronchitis, unspecified: Secondary | ICD-10-CM | POA: Diagnosis not present

## 2022-08-24 DIAGNOSIS — I10 Essential (primary) hypertension: Secondary | ICD-10-CM | POA: Diagnosis not present

## 2022-08-24 DIAGNOSIS — J208 Acute bronchitis due to other specified organisms: Secondary | ICD-10-CM | POA: Diagnosis not present

## 2022-08-24 DIAGNOSIS — J8410 Pulmonary fibrosis, unspecified: Secondary | ICD-10-CM | POA: Diagnosis not present

## 2022-09-10 DIAGNOSIS — H40003 Preglaucoma, unspecified, bilateral: Secondary | ICD-10-CM | POA: Diagnosis not present

## 2022-09-10 DIAGNOSIS — H25813 Combined forms of age-related cataract, bilateral: Secondary | ICD-10-CM | POA: Diagnosis not present

## 2022-09-14 ENCOUNTER — Telehealth: Payer: Self-pay | Admitting: Cardiology

## 2022-09-14 DIAGNOSIS — J84112 Idiopathic pulmonary fibrosis: Secondary | ICD-10-CM | POA: Diagnosis not present

## 2022-09-14 DIAGNOSIS — R911 Solitary pulmonary nodule: Secondary | ICD-10-CM | POA: Diagnosis not present

## 2022-09-14 DIAGNOSIS — Z8616 Personal history of COVID-19: Secondary | ICD-10-CM | POA: Diagnosis not present

## 2022-09-14 DIAGNOSIS — J4520 Mild intermittent asthma, uncomplicated: Secondary | ICD-10-CM | POA: Diagnosis not present

## 2022-09-14 NOTE — Telephone Encounter (Signed)
Patient states Chodri wanted to refill her Esbriet but Dr. Dulce Sellar had it held because of AFIB  Please advise-  [phone]

## 2022-09-14 NOTE — Telephone Encounter (Signed)
Left vm to call back

## 2022-09-14 NOTE — Telephone Encounter (Signed)
Note reviewed with pt as per Dr. Hulen Shouts note.  Pt verbalized understanding and had no additional questions.

## 2022-11-16 DIAGNOSIS — R609 Edema, unspecified: Secondary | ICD-10-CM | POA: Diagnosis not present

## 2022-11-16 DIAGNOSIS — Z9181 History of falling: Secondary | ICD-10-CM | POA: Diagnosis not present

## 2022-11-16 DIAGNOSIS — J4520 Mild intermittent asthma, uncomplicated: Secondary | ICD-10-CM | POA: Diagnosis not present

## 2022-11-16 DIAGNOSIS — E785 Hyperlipidemia, unspecified: Secondary | ICD-10-CM | POA: Diagnosis not present

## 2022-11-16 DIAGNOSIS — J8410 Pulmonary fibrosis, unspecified: Secondary | ICD-10-CM | POA: Diagnosis not present

## 2022-11-16 DIAGNOSIS — Z1331 Encounter for screening for depression: Secondary | ICD-10-CM | POA: Diagnosis not present

## 2022-11-16 DIAGNOSIS — H8113 Benign paroxysmal vertigo, bilateral: Secondary | ICD-10-CM | POA: Diagnosis not present

## 2022-11-25 DIAGNOSIS — R918 Other nonspecific abnormal finding of lung field: Secondary | ICD-10-CM | POA: Diagnosis not present

## 2022-11-25 DIAGNOSIS — I700 Atherosclerosis of aorta: Secondary | ICD-10-CM | POA: Diagnosis not present

## 2022-11-25 DIAGNOSIS — J84112 Idiopathic pulmonary fibrosis: Secondary | ICD-10-CM | POA: Diagnosis not present

## 2022-11-25 DIAGNOSIS — J479 Bronchiectasis, uncomplicated: Secondary | ICD-10-CM | POA: Diagnosis not present

## 2022-11-25 DIAGNOSIS — R911 Solitary pulmonary nodule: Secondary | ICD-10-CM | POA: Diagnosis not present

## 2022-11-30 ENCOUNTER — Telehealth: Payer: Self-pay

## 2022-11-30 ENCOUNTER — Other Ambulatory Visit: Payer: Self-pay

## 2022-11-30 DIAGNOSIS — Z8616 Personal history of COVID-19: Secondary | ICD-10-CM | POA: Diagnosis not present

## 2022-11-30 DIAGNOSIS — J4520 Mild intermittent asthma, uncomplicated: Secondary | ICD-10-CM | POA: Diagnosis not present

## 2022-11-30 DIAGNOSIS — J84112 Idiopathic pulmonary fibrosis: Secondary | ICD-10-CM | POA: Diagnosis not present

## 2022-11-30 DIAGNOSIS — J479 Bronchiectasis, uncomplicated: Secondary | ICD-10-CM | POA: Diagnosis not present

## 2022-11-30 NOTE — Telephone Encounter (Signed)
Patient came by the office after having an appointment with Dr. Alcide Clever. She stated that she had been having dizzy spells and her blood pressure was elevated. Today her blood pressure was 150/90. Patient stated stated that Dr. Alcide Clever thought her diltiazem could be causing her dizzy spells and that he recommended that she follow up with Dr. Bettina Gavia. Please advise.

## 2022-12-01 DIAGNOSIS — I471 Supraventricular tachycardia, unspecified: Secondary | ICD-10-CM | POA: Diagnosis not present

## 2022-12-01 DIAGNOSIS — R69 Illness, unspecified: Secondary | ICD-10-CM | POA: Diagnosis not present

## 2022-12-01 DIAGNOSIS — R002 Palpitations: Secondary | ICD-10-CM | POA: Diagnosis not present

## 2022-12-01 DIAGNOSIS — R55 Syncope and collapse: Secondary | ICD-10-CM | POA: Diagnosis not present

## 2022-12-01 DIAGNOSIS — H8113 Benign paroxysmal vertigo, bilateral: Secondary | ICD-10-CM | POA: Diagnosis not present

## 2022-12-01 DIAGNOSIS — R42 Dizziness and giddiness: Secondary | ICD-10-CM | POA: Diagnosis not present

## 2022-12-01 NOTE — Telephone Encounter (Signed)
Called patient and informed her of Dr. Joya Gaskins recommendation below:  "Annette Tran I know her very well diltiazem is not related to her dizziness I think it is best she follows up family doctor and not with cardiology."  Patient had no further questions at this time.

## 2022-12-04 ENCOUNTER — Ambulatory Visit: Payer: Medicare HMO | Admitting: Cardiology

## 2022-12-07 DIAGNOSIS — H6091 Unspecified otitis externa, right ear: Secondary | ICD-10-CM | POA: Diagnosis not present

## 2022-12-07 DIAGNOSIS — H81399 Other peripheral vertigo, unspecified ear: Secondary | ICD-10-CM | POA: Diagnosis not present

## 2022-12-07 DIAGNOSIS — H6122 Impacted cerumen, left ear: Secondary | ICD-10-CM | POA: Diagnosis not present

## 2022-12-10 NOTE — Progress Notes (Signed)
Cardiology Office Note:    Date:  12/11/2022   ID:  Annette Tran, DOB 12/29/1945, MRN [id number]  PCP:  Nicoletta Dress, MD  Cardiologist:  Shirlee More, MD    Referring MD: Nicoletta Dress, MD    ASSESSMENT:    1. SVT (supraventricular tachycardia)   2. Pulmonary fibrosis ([place])   3. Hypercholesterolemia    PLAN:    In order of problems listed above:  She has done well with low-dose Cardizem continue the same her symptoms of disequilibrium and vertigo Being cared for managed by pulmonary Continue statin LDL is at target I think she would benefit from checking home blood pressures postulated but also seeing ENT for persistent symptoms of vertigo and disequilibrium   Next appointment: 1 year   Medication Adjustments/Labs and Tests Ordered: Current medicines are reviewed at length with the patient today.  Concerns regarding medicines are outlined above.  No orders of the defined types were placed in this encounter.  No orders of the defined types were placed in this encounter.   Chief Complaint  Patient presents with   Follow-up    SVT     History of Present Illness:    Annette Tran is a 77 y.o. female with a hx of SVT, pulmonary fibrosis previous COVID infection and asthma. last seen 07/15/2022.She had an echocardiogram 04/30/2022 cannot be independently reviewed conclusion normal left ventricular size wall thickness systolic function grade 1 diastolic dysfunction normal right ventricular size and function and trivial tricuspid regurgitation trivial mitral regurgitation normal pulmonary artery pressure.   She was wearing an event monitor when she went to the emergency room it showed the presence of rare ventricular ectopy rare supraventricular ectopy no episodes of bradycardia sinus arrest or second or third-degree AV block.  There were frequent episodes of SVT longest episode 35 seconds rate of 110 bpm.  Triggered events were all associated with sinus rhythm and  one of them with brief atrial tachycardia.   She was seen at [facility] ED 05/20/2022 EKG independently reviewed showed sinus rhythm best described as incomplete right bundle branch block left atrial abnormality sinus rhythm CT of the chest showed pulmonary fibrosis in January 2023.  Compliance with diet, lifestyle and medications: Yes  She sees pulmonary and is being treated for pulmonary stenosis She has had no episodes of rapid heart rhythm She has vague disequilibrium at times vertigo has tried physical therapy without relief and it is really disrupting her life She was concerned it was related to Cardizem I advised her to be seen by ENT and for completeness to get a validated blood pressure device and to start recording her blood pressure sitting and standing at home bring to physician appointments Past Medical History:  Diagnosis Date   Arthritis    Asthma    Depression    GERD (gastroesophageal reflux disease)    High cholesterol    Hypercholesterolemia 08/16/2017   Multiple food allergies    "caffeine, onions, garlic, peppermint, chocolate" (08/14/2013)   Neuromuscular disorder ([place])    abd tightness and numbness after c-section   Osteoarthritis    "all over" (08/14/2013)   Osteoarthritis of left knee 08/03/2013   PONV (postoperative nausea and vomiting)    Seasonal allergies    Shortness of breath    started 1 mth ago (read note)   Unsteady gait 04/03/2020   Vertigo 04/03/2020    Past Surgical History:  Procedure Laterality Date   ABDOMINAL HYSTERECTOMY  1997   APPENDECTOMY  [place] OF UTERUS  1972; 1973   "miscarriages" (08/14/2013)   GROIN MASS OPEN BIOPSY Right 1974   "had a mammary gland excised from here" (08/14/2013)   [place]  10/24/2012   Procedure: TOTAL KNEE ARTHROPLASTY;  Surgeon: Vickey Huger, MD;  Location: [place];  Service: Orthopedics;  Laterality: Right;   right total knee arthroplasty   TOTAL KNEE ARTHROPLASTY Left 08/14/2013   TOTAL KNEE ARTHROPLASTY Left 08/14/2013   Procedure: LEFT TOTAL KNEE ARTHROPLASTY;  Surgeon: Vickey Huger, MD;  Location: [place];  Service: Orthopedics;  Laterality: Left;    Current Medications: Current Meds  Medication Sig   atorvastatin (LIPITOR) 10 MG tablet Take 10 mg by mouth daily.   diltiazem (CARDIZEM CD) 120 MG 24 hr capsule Take 1 capsule (120 mg total) by mouth daily.   EPINEPHrine 0.3 MG/0.3ML SOSY Inject 0.3 mg as directed as needed (allergic reaction).   fluticasone furoate-vilanterol (BREO ELLIPTA) 100-25 MCG/ACT AEPB Inhale 1 puff into the lungs daily.   furosemide (LASIX) 20 MG tablet Take 20 mg by mouth daily as needed for fluid or edema.   meclizine (ANTIVERT) 25 MG tablet Take 25 mg by mouth 3 (three) times daily as needed for dizziness (Takes 1 tablet daily).   Pirfenidone (ESBRIET) 267 MG CAPS Take 267 mg by mouth daily.   SYMBICORT 160-4.5 MCG/ACT inhaler Inhale 2 puffs into the lungs 2 (two) times daily.     Allergies:   Demerol [meperidine], Other, Shellfish allergy, and Doxycycline   Social History   Socioeconomic History   Marital status: Divorced    Spouse name: Not on file   Number of children: Not on file   Years of education: Not on file   Highest education level: Not on file  Occupational History   Not on file  Tobacco Use   Smoking status: Never    Passive exposure: Past   Smokeless tobacco: Never  Vaping Use   Vaping Use: Never used  Substance and Sexual Activity   Alcohol use: No   Drug use: No   Sexual activity: Never  Other Topics Concern   Not on file  Social History Narrative   Not on file   Social Determinants of Health   Financial Resource Strain: Not on file  Food Insecurity: Not on file  Transportation Needs: Not on file  Physical Activity: Not on file  Stress: Not on file  Social Connections: Not on file     Family History: The patient's family  history includes Breast cancer in her mother; Heart disease in her father; Tuberculosis in her mother. ROS:   Please see the history of present illness.    All other systems reviewed and are negative.  EKGs/Labs/Other Studies Reviewed:    The following studies were reviewed today:  Recent Labs: 11/16/2022 cholesterol 196 LDL 113 triglycerides 91 hemoglobin 14.1 creatinine 0.71 potassium 4.4  Physical Exam:    VS:  BP 104/68 (BP Location: Left Arm, Patient Position: Sitting)   Pulse 82   Ht 5' 4"$  (1.626 m)   Wt 138 lb 12.8 oz (63 kg)   SpO2 96%   BMI 23.82 kg/m     Wt Readings from Last 3 Encounters:  12/11/22 138 lb 12.8 oz (63 kg)  07/15/22 137 lb (62.1 kg)  06/03/22 138 lb (62.6 kg)     GEN:  Well nourished, well developed in no acute  distress HEENT: Normal NECK: No JVD; No carotid bruits LYMPHATICS: No lymphadenopathy CARDIAC: RRR, no murmurs, rubs, gallops RESPIRATORY:  Clear to auscultation without rales, wheezing or rhonchi  ABDOMEN: Soft, non-tender, non-distended MUSCULOSKELETAL:  No edema; No deformity  SKIN: Warm and dry NEUROLOGIC:  Alert and oriented x 3 PSYCHIATRIC:  Normal affect   Seen with Alfredia Client CMA chaperone   Signed, Shirlee More, MD  12/11/2022 9:54 AM    [facility]

## 2022-12-11 ENCOUNTER — Ambulatory Visit: Payer: Medicare HMO | Attending: Cardiology | Admitting: Cardiology

## 2022-12-11 ENCOUNTER — Encounter: Payer: Self-pay | Admitting: Cardiology

## 2022-12-11 VITALS — BP 104/68 | HR 82 | Ht 64.0 in | Wt 138.8 lb

## 2022-12-11 DIAGNOSIS — E7800 Pure hypercholesterolemia, unspecified: Secondary | ICD-10-CM

## 2022-12-11 DIAGNOSIS — J8410 Pulmonary fibrosis, unspecified: Secondary | ICD-10-CM

## 2022-12-11 DIAGNOSIS — I471 Supraventricular tachycardia, unspecified: Secondary | ICD-10-CM

## 2022-12-11 NOTE — Patient Instructions (Addendum)
Medication Instructions:  Your physician recommends that you continue on your current medications as directed. Please refer to the Current Medication list given to you today.  *If you need a refill on your cardiac medications before your next appointment, please call your pharmacy*   Lab Work: NONE If you have labs (blood work) drawn today and your tests are completely normal, you will receive your results only by: [place] (if you have MyChart) OR A paper copy in the mail If you have any lab test that is abnormal or we need to change your treatment, we will call you to review the results.   Testing/Procedures: NONE   Follow-Up: At [facility], you and your health needs are our priority.  As part of our continuing mission to provide you with exceptional heart care, we have created designated Provider Care Teams.  These Care Teams include your primary Cardiologist (physician) and Advanced Practice Providers (APPs -  Physician Assistants and Nurse Practitioners) who all work together to provide you with the care you need, when you need it.  We recommend signing up for the patient portal called "MyChart".  Sign up information is provided on this After Visit Summary.  MyChart is used to connect with patients for Virtual Visits (Telemedicine).  Patients are able to view lab/test results, encounter notes, upcoming appointments, etc.  Non-urgent messages can be sent to your provider as well.   To learn more about what you can do with MyChart, go to [email or web address].    Your next appointment:   1 year(s)  Provider:   Shirlee More, MD    Other Instruction  Omron BP Device Recommended. Ck BP daily Sitting and Standing and record results See ENT for Vertigo  [employer]  Tips to measure your blood pressure correctly  To determine whether you have hypertension, a medical professional will take a blood pressure reading. How you prepare for the test, the position of your  arm, and other factors can change a blood pressure reading by 10% or more. That could be enough to hide high blood pressure, start you on a drug you don't really need, or lead your doctor to incorrectly adjust your medications. National and international guidelines offer specific instructions for measuring blood pressure. If a doctor, nurse, or medical assistant isn't doing it right, don't hesitate to ask him or her to get with the guidelines. Here's what you can do to ensure a correct reading:  Don't drink a caffeinated beverage or smoke during the 30 minutes before the test.  Sit quietly for five minutes before the test begins.  During the measurement, sit in a chair with your feet on the floor and your arm supported so your elbow is at about heart level.  The inflatable part of the cuff should completely cover at least 80% of your upper arm, and the cuff should be placed on bare skin, not over a shirt.  Don't talk during the measurement.  Have your blood pressure measured twice, with a brief break in between. If the readings are different by 5 points or more, have it done a third time. There are times to break these rules. If you sometimes feel lightheaded when getting out of bed in the morning or when you stand after sitting, you should have your blood pressure checked while seated and then while standing to see if it falls from one position to the next. Because blood pressure varies throughout the day, your doctor will rarely diagnose hypertension on the  basis of a single reading. Instead, he or she will want to confirm the measurements on at least two occasions, usually within a few weeks of one another. The exception to this rule is if you have a blood pressure reading of 180/110 mm Hg or higher. A result this high usually calls for prompt treatment. It's also a good idea to have your blood pressure measured in both arms at least once, since the reading in one arm (usually the right) may be higher  than that in the left. A 2014 study in The American Journal of Medicine of nearly 3,400 people found average arm- to-arm differences in systolic blood pressure of about 5 points. The higher number should be used to make treatment decisions. In 2017, new guidelines from the [employer], the [employer] of Cardiology, and nine other health organizations lowered the diagnosis of high blood pressure to 130/80 mm Hg or higher for all adults. The guidelines also redefined the various blood pressure categories to now include normal, elevated, Stage 1 hypertension, Stage 2 hypertension, and hypertensive crisis (see "Blood pressure categories"). Blood pressure categories  Blood pressure category SYSTOLIC (upper number)  DIASTOLIC (lower number)  Normal Less than 120 mm Hg and Less than 80 mm Hg  Elevated 120-129 mm Hg and Less than 80 mm Hg  High blood pressure: Stage 1 hypertension 130-139 mm Hg or 80-89 mm Hg  High blood pressure: Stage 2 hypertension 140 mm Hg or higher or 90 mm Hg or higher  Hypertensive crisis (consult your doctor immediately) Higher than 180 mm Hg and/or Higher than 120 mm Hg  Source: American Heart Association and American Stroke Association. For more on getting your blood pressure under control, buy Controlling Your Blood Pressure, a Special Health Report from [facility]  This visit was accompanied by Jerl Santos.

## 2022-12-28 DIAGNOSIS — Z8616 Personal history of COVID-19: Secondary | ICD-10-CM | POA: Diagnosis not present

## 2022-12-28 DIAGNOSIS — J84112 Idiopathic pulmonary fibrosis: Secondary | ICD-10-CM | POA: Diagnosis not present

## 2022-12-28 DIAGNOSIS — J479 Bronchiectasis, uncomplicated: Secondary | ICD-10-CM | POA: Diagnosis not present

## 2022-12-28 DIAGNOSIS — J4520 Mild intermittent asthma, uncomplicated: Secondary | ICD-10-CM | POA: Diagnosis not present

## 2023-01-12 NOTE — Progress Notes (Deleted)
Cardiology Office Note:    Date:  01/12/2023   ID:  Annette Tran, DOB Sep 16, 1946, MRN [id number]  PCP:  Nicoletta Dress, MD  Cardiologist:  Shirlee More, MD    Referring MD: Nicoletta Dress, MD    ASSESSMENT:    No diagnosis found. PLAN:    In order of problems listed above:  ***   Next appointment: ***   Medication Adjustments/Labs and Tests Ordered: Current medicines are reviewed at length with the patient today.  Concerns regarding medicines are outlined above.  No orders of the defined types were placed in this encounter.  No orders of the defined types were placed in this encounter.   No chief complaint on file.   History of Present Illness:    Annette Tran is a 77 y.o. female with a hx of SVT previous COVID infection has been fibrosis last seen 12/11/2022. Compliance with diet, lifestyle and medications: *** Past Medical History:  Diagnosis Date   Arthritis    Asthma    Depression    GERD (gastroesophageal reflux disease)    High cholesterol    Hypercholesterolemia 08/16/2017   Multiple food allergies    "caffeine, onions, garlic, peppermint, chocolate" (08/14/2013)   Neuromuscular disorder (HCC)    abd tightness and numbness after c-section   Osteoarthritis    "all over" (08/14/2013)   Osteoarthritis of left knee 08/03/2013   PONV (postoperative nausea and vomiting)    Seasonal allergies    Shortness of breath    started 1 mth ago (read note)   Unsteady gait 04/03/2020   Vertigo 04/03/2020    Past Surgical History:  Procedure Laterality Date   [place]; 1973   "miscarriages" (08/14/2013)   GROIN MASS OPEN BIOPSY Right 1974   "had a mammary gland excised from here" (08/14/2013)   [place]  10/24/2012   Procedure: TOTAL KNEE ARTHROPLASTY;  Surgeon: Vickey Huger, MD;  Location: [place];   Service: Orthopedics;  Laterality: Right;  right total knee arthroplasty   TOTAL KNEE ARTHROPLASTY Left 08/14/2013   TOTAL KNEE ARTHROPLASTY Left 08/14/2013   Procedure: LEFT TOTAL KNEE ARTHROPLASTY;  Surgeon: Vickey Huger, MD;  Location: [place];  Service: Orthopedics;  Laterality: Left;    Current Medications: No outpatient medications have been marked as taking for the 01/13/23 encounter (Appointment) with Richardo Priest, MD.     Allergies:   Demerol [meperidine], Other, Shellfish allergy, and Doxycycline   Social History   Socioeconomic History   Marital status: Divorced    Spouse name: Not on file   Number of children: Not on file   Years of education: Not on file   Highest education level: Not on file  Occupational History   Not on file  Tobacco Use   Smoking status: Never    Passive exposure: Past   Smokeless tobacco: Never  Vaping Use   Vaping Use: Never used  Substance and Sexual Activity   Alcohol use: No   Drug use: No   Sexual activity: Never  Other Topics Concern   Not on file  Social History Narrative   Not on file   Social Determinants of Health   Financial Resource Strain: Not on file  Food Insecurity: Not on file  Transportation Needs: Not on file  Physical Activity: Not on file  Stress: Not  on file  Social Connections: Not on file     Family History: The patient's ***family history includes Breast cancer in her mother; Heart disease in her father; Tuberculosis in her mother. ROS:   Please see the history of present illness.    All other systems reviewed and are negative.  EKGs/Labs/Other Studies Reviewed:    The following studies were reviewed today:  Cardiac Studies & Procedures         MONITORS  LONG TERM MONITOR (3-14 DAYS) 05/29/2022  Narrative Patch Wear Time:  8 days and 5 hours (2023-07-11T14:40:52-0400 to 2023-07-19T20:35:04-399)  Patient had a min HR of 48 bpm, max HR of 231 bpm, and avg HR of 77 bpm. Predominant underlying  rhythm was Sinus Rhythm.  There were no episodes of bradycardia sinus arrest secondary third-degree AV block  52 Supraventricular Tachycardia runs occurred, the run with the fastest interval lasting 5 beats with a max rate of 231 bpm, the longest lasting 34.8 secs with an avg rate of 110 bpm. Some episodes of Supraventricular Tachycardia may be possible Atrial Tachycardia with variable block. Supraventricular Tachycardia was detected within +/- 45 seconds of symptomatic patient event(s).  There were no episodes of atrial fibrillation or flutter  Isolated SVEs were rare (<1.0%), SVE Couplets were rare (<1.0%), and SVE  Triplets were rare (<1.0%). Isolated VEs were rare (<1.0%), and no VE Couplets or VE Triplets were present.  She had 4 triggered and 9 diary events all sinus rhythm one of them was associated with a brief run of atrial tachycardia           EKG:  EKG ordered today and personally reviewed.  The ekg ordered today demonstrates ***  Recent Labs: No results found for requested labs within last 365 days.  Recent Lipid Panel No results found for: "CHOL", "TRIG", "HDL", "CHOLHDL", "VLDL", "LDLCALC", "LDLDIRECT"  Physical Exam:    VS:  There were no vitals taken for this visit.    Wt Readings from Last 3 Encounters:  12/11/22 138 lb 12.8 oz (63 kg)  07/15/22 137 lb (62.1 kg)  06/03/22 138 lb (62.6 kg)     GEN: *** Well nourished, well developed in no acute distress HEENT: Normal NECK: No JVD; No carotid bruits LYMPHATICS: No lymphadenopathy CARDIAC: ***RRR, no murmurs, rubs, gallops RESPIRATORY:  Clear to auscultation without rales, wheezing or rhonchi  ABDOMEN: Soft, non-tender, non-distended MUSCULOSKELETAL:  No edema; No deformity  SKIN: Warm and dry NEUROLOGIC:  Alert and oriented x 3 PSYCHIATRIC:  Normal affect    Signed, Shirlee More, MD  01/12/2023 9:32 AM    [place]

## 2023-01-13 ENCOUNTER — Ambulatory Visit: Payer: Medicare HMO | Admitting: Cardiology

## 2023-01-22 DIAGNOSIS — R21 Rash and other nonspecific skin eruption: Secondary | ICD-10-CM | POA: Diagnosis not present

## 2023-01-22 DIAGNOSIS — T7840XA Allergy, unspecified, initial encounter: Secondary | ICD-10-CM | POA: Diagnosis not present

## 2023-01-22 DIAGNOSIS — Z91038 Other insect allergy status: Secondary | ICD-10-CM | POA: Diagnosis not present

## 2023-01-25 DIAGNOSIS — J479 Bronchiectasis, uncomplicated: Secondary | ICD-10-CM | POA: Diagnosis not present

## 2023-01-25 DIAGNOSIS — J84112 Idiopathic pulmonary fibrosis: Secondary | ICD-10-CM | POA: Diagnosis not present

## 2023-01-25 DIAGNOSIS — J4520 Mild intermittent asthma, uncomplicated: Secondary | ICD-10-CM | POA: Diagnosis not present

## 2023-01-25 DIAGNOSIS — Z8616 Personal history of COVID-19: Secondary | ICD-10-CM | POA: Diagnosis not present

## 2023-02-12 DIAGNOSIS — R0600 Dyspnea, unspecified: Secondary | ICD-10-CM | POA: Diagnosis not present

## 2023-02-12 DIAGNOSIS — J4520 Mild intermittent asthma, uncomplicated: Secondary | ICD-10-CM | POA: Diagnosis not present

## 2023-02-12 DIAGNOSIS — Z03818 Encounter for observation for suspected exposure to other biological agents ruled out: Secondary | ICD-10-CM | POA: Diagnosis not present

## 2023-02-12 DIAGNOSIS — R051 Acute cough: Secondary | ICD-10-CM | POA: Diagnosis not present

## 2023-02-12 DIAGNOSIS — J479 Bronchiectasis, uncomplicated: Secondary | ICD-10-CM | POA: Diagnosis not present

## 2023-02-12 DIAGNOSIS — Z8616 Personal history of COVID-19: Secondary | ICD-10-CM | POA: Diagnosis not present

## 2023-02-12 DIAGNOSIS — J84112 Idiopathic pulmonary fibrosis: Secondary | ICD-10-CM | POA: Diagnosis not present

## 2023-02-12 DIAGNOSIS — R0981 Nasal congestion: Secondary | ICD-10-CM | POA: Diagnosis not present

## 2023-02-15 DIAGNOSIS — J84112 Idiopathic pulmonary fibrosis: Secondary | ICD-10-CM | POA: Diagnosis not present

## 2023-02-15 DIAGNOSIS — J479 Bronchiectasis, uncomplicated: Secondary | ICD-10-CM | POA: Diagnosis not present

## 2023-02-15 DIAGNOSIS — B9781 Human metapneumovirus as the cause of diseases classified elsewhere: Secondary | ICD-10-CM | POA: Diagnosis not present

## 2023-02-15 DIAGNOSIS — J4520 Mild intermittent asthma, uncomplicated: Secondary | ICD-10-CM | POA: Diagnosis not present

## 2023-02-15 DIAGNOSIS — Z8616 Personal history of COVID-19: Secondary | ICD-10-CM | POA: Diagnosis not present

## 2023-02-19 DIAGNOSIS — J479 Bronchiectasis, uncomplicated: Secondary | ICD-10-CM | POA: Diagnosis not present

## 2023-02-19 DIAGNOSIS — J84112 Idiopathic pulmonary fibrosis: Secondary | ICD-10-CM | POA: Diagnosis not present

## 2023-02-19 DIAGNOSIS — J4520 Mild intermittent asthma, uncomplicated: Secondary | ICD-10-CM | POA: Diagnosis not present

## 2023-02-19 DIAGNOSIS — B9781 Human metapneumovirus as the cause of diseases classified elsewhere: Secondary | ICD-10-CM | POA: Diagnosis not present

## 2023-02-19 DIAGNOSIS — Z8616 Personal history of COVID-19: Secondary | ICD-10-CM | POA: Diagnosis not present

## 2023-03-05 DIAGNOSIS — J4520 Mild intermittent asthma, uncomplicated: Secondary | ICD-10-CM | POA: Diagnosis not present

## 2023-03-05 DIAGNOSIS — J479 Bronchiectasis, uncomplicated: Secondary | ICD-10-CM | POA: Diagnosis not present

## 2023-03-05 DIAGNOSIS — J84112 Idiopathic pulmonary fibrosis: Secondary | ICD-10-CM | POA: Diagnosis not present

## 2023-03-05 DIAGNOSIS — Z8616 Personal history of COVID-19: Secondary | ICD-10-CM | POA: Diagnosis not present

## 2023-03-30 DIAGNOSIS — H8113 Benign paroxysmal vertigo, bilateral: Secondary | ICD-10-CM | POA: Diagnosis not present

## 2023-04-16 DIAGNOSIS — J84112 Idiopathic pulmonary fibrosis: Secondary | ICD-10-CM | POA: Diagnosis not present

## 2023-04-16 DIAGNOSIS — J479 Bronchiectasis, uncomplicated: Secondary | ICD-10-CM | POA: Diagnosis not present

## 2023-04-16 DIAGNOSIS — Z8616 Personal history of COVID-19: Secondary | ICD-10-CM | POA: Diagnosis not present

## 2023-04-16 DIAGNOSIS — J4520 Mild intermittent asthma, uncomplicated: Secondary | ICD-10-CM | POA: Diagnosis not present

## 2023-05-03 DIAGNOSIS — J4520 Mild intermittent asthma, uncomplicated: Secondary | ICD-10-CM | POA: Diagnosis not present

## 2023-05-03 DIAGNOSIS — J479 Bronchiectasis, uncomplicated: Secondary | ICD-10-CM | POA: Diagnosis not present

## 2023-05-03 DIAGNOSIS — Z8616 Personal history of COVID-19: Secondary | ICD-10-CM | POA: Diagnosis not present

## 2023-05-03 DIAGNOSIS — J84112 Idiopathic pulmonary fibrosis: Secondary | ICD-10-CM | POA: Diagnosis not present

## 2023-05-17 DIAGNOSIS — E785 Hyperlipidemia, unspecified: Secondary | ICD-10-CM | POA: Diagnosis not present

## 2023-05-17 DIAGNOSIS — F320 Major depressive disorder, single episode, mild: Secondary | ICD-10-CM | POA: Diagnosis not present

## 2023-05-17 DIAGNOSIS — Z79899 Other long term (current) drug therapy: Secondary | ICD-10-CM | POA: Diagnosis not present

## 2023-05-17 DIAGNOSIS — H8113 Benign paroxysmal vertigo, bilateral: Secondary | ICD-10-CM | POA: Diagnosis not present

## 2023-05-17 DIAGNOSIS — I471 Supraventricular tachycardia, unspecified: Secondary | ICD-10-CM | POA: Diagnosis not present

## 2023-05-17 DIAGNOSIS — Z139 Encounter for screening, unspecified: Secondary | ICD-10-CM | POA: Diagnosis not present

## 2023-06-17 DIAGNOSIS — Z809 Family history of malignant neoplasm, unspecified: Secondary | ICD-10-CM | POA: Diagnosis not present

## 2023-06-17 DIAGNOSIS — G2581 Restless legs syndrome: Secondary | ICD-10-CM | POA: Diagnosis not present

## 2023-06-17 DIAGNOSIS — R32 Unspecified urinary incontinence: Secondary | ICD-10-CM | POA: Diagnosis not present

## 2023-06-17 DIAGNOSIS — E785 Hyperlipidemia, unspecified: Secondary | ICD-10-CM | POA: Diagnosis not present

## 2023-06-17 DIAGNOSIS — Z8249 Family history of ischemic heart disease and other diseases of the circulatory system: Secondary | ICD-10-CM | POA: Diagnosis not present

## 2023-06-17 DIAGNOSIS — M810 Age-related osteoporosis without current pathological fracture: Secondary | ICD-10-CM | POA: Diagnosis not present

## 2023-06-17 DIAGNOSIS — I129 Hypertensive chronic kidney disease with stage 1 through stage 4 chronic kidney disease, or unspecified chronic kidney disease: Secondary | ICD-10-CM | POA: Diagnosis not present

## 2023-06-17 DIAGNOSIS — J45909 Unspecified asthma, uncomplicated: Secondary | ICD-10-CM | POA: Diagnosis not present

## 2023-06-17 DIAGNOSIS — I471 Supraventricular tachycardia, unspecified: Secondary | ICD-10-CM | POA: Diagnosis not present

## 2023-06-17 DIAGNOSIS — K219 Gastro-esophageal reflux disease without esophagitis: Secondary | ICD-10-CM | POA: Diagnosis not present

## 2023-06-17 DIAGNOSIS — R42 Dizziness and giddiness: Secondary | ICD-10-CM | POA: Diagnosis not present

## 2023-06-17 DIAGNOSIS — F325 Major depressive disorder, single episode, in full remission: Secondary | ICD-10-CM | POA: Diagnosis not present

## 2023-06-28 DIAGNOSIS — E559 Vitamin D deficiency, unspecified: Secondary | ICD-10-CM | POA: Diagnosis not present

## 2023-06-28 DIAGNOSIS — J479 Bronchiectasis, uncomplicated: Secondary | ICD-10-CM | POA: Diagnosis not present

## 2023-06-28 DIAGNOSIS — R252 Cramp and spasm: Secondary | ICD-10-CM | POA: Diagnosis not present

## 2023-06-28 DIAGNOSIS — J84112 Idiopathic pulmonary fibrosis: Secondary | ICD-10-CM | POA: Diagnosis not present

## 2023-06-28 DIAGNOSIS — Z8616 Personal history of COVID-19: Secondary | ICD-10-CM | POA: Diagnosis not present

## 2023-06-28 DIAGNOSIS — J4520 Mild intermittent asthma, uncomplicated: Secondary | ICD-10-CM | POA: Diagnosis not present

## 2023-06-28 DIAGNOSIS — R498 Other voice and resonance disorders: Secondary | ICD-10-CM | POA: Diagnosis not present

## 2023-07-01 ENCOUNTER — Other Ambulatory Visit: Payer: Self-pay | Admitting: Cardiology

## 2023-07-16 DIAGNOSIS — Z8616 Personal history of COVID-19: Secondary | ICD-10-CM | POA: Diagnosis not present

## 2023-07-16 DIAGNOSIS — E559 Vitamin D deficiency, unspecified: Secondary | ICD-10-CM | POA: Diagnosis not present

## 2023-07-16 DIAGNOSIS — J84112 Idiopathic pulmonary fibrosis: Secondary | ICD-10-CM | POA: Diagnosis not present

## 2023-07-16 DIAGNOSIS — J4520 Mild intermittent asthma, uncomplicated: Secondary | ICD-10-CM | POA: Diagnosis not present

## 2023-07-16 DIAGNOSIS — J479 Bronchiectasis, uncomplicated: Secondary | ICD-10-CM | POA: Diagnosis not present

## 2023-09-15 DIAGNOSIS — H25813 Combined forms of age-related cataract, bilateral: Secondary | ICD-10-CM | POA: Diagnosis not present

## 2023-09-28 DIAGNOSIS — J301 Allergic rhinitis due to pollen: Secondary | ICD-10-CM | POA: Diagnosis not present

## 2023-09-28 DIAGNOSIS — J479 Bronchiectasis, uncomplicated: Secondary | ICD-10-CM | POA: Diagnosis not present

## 2023-09-28 DIAGNOSIS — J4520 Mild intermittent asthma, uncomplicated: Secondary | ICD-10-CM | POA: Diagnosis not present

## 2023-09-28 DIAGNOSIS — J84112 Idiopathic pulmonary fibrosis: Secondary | ICD-10-CM | POA: Diagnosis not present

## 2023-10-11 ENCOUNTER — Other Ambulatory Visit: Payer: Self-pay | Admitting: Cardiology

## 2023-11-23 DIAGNOSIS — H8113 Benign paroxysmal vertigo, bilateral: Secondary | ICD-10-CM | POA: Diagnosis not present

## 2023-11-23 DIAGNOSIS — F411 Generalized anxiety disorder: Secondary | ICD-10-CM | POA: Diagnosis not present

## 2023-11-23 DIAGNOSIS — J8410 Pulmonary fibrosis, unspecified: Secondary | ICD-10-CM | POA: Diagnosis not present

## 2023-11-23 DIAGNOSIS — R609 Edema, unspecified: Secondary | ICD-10-CM | POA: Diagnosis not present

## 2023-11-23 DIAGNOSIS — J4520 Mild intermittent asthma, uncomplicated: Secondary | ICD-10-CM | POA: Diagnosis not present

## 2023-11-23 DIAGNOSIS — Z6824 Body mass index (BMI) 24.0-24.9, adult: Secondary | ICD-10-CM | POA: Diagnosis not present

## 2023-11-23 DIAGNOSIS — M25512 Pain in left shoulder: Secondary | ICD-10-CM | POA: Diagnosis not present

## 2023-11-23 DIAGNOSIS — Z79899 Other long term (current) drug therapy: Secondary | ICD-10-CM | POA: Diagnosis not present

## 2023-11-23 DIAGNOSIS — E785 Hyperlipidemia, unspecified: Secondary | ICD-10-CM | POA: Diagnosis not present

## 2023-11-23 DIAGNOSIS — M65332 Trigger finger, left middle finger: Secondary | ICD-10-CM | POA: Diagnosis not present

## 2023-11-23 DIAGNOSIS — E538 Deficiency of other specified B group vitamins: Secondary | ICD-10-CM | POA: Diagnosis not present

## 2023-12-02 DIAGNOSIS — M65332 Trigger finger, left middle finger: Secondary | ICD-10-CM | POA: Diagnosis not present

## 2023-12-02 DIAGNOSIS — M7542 Impingement syndrome of left shoulder: Secondary | ICD-10-CM | POA: Diagnosis not present

## 2023-12-02 DIAGNOSIS — Z008 Encounter for other general examination: Secondary | ICD-10-CM | POA: Diagnosis not present

## 2023-12-03 ENCOUNTER — Encounter: Payer: Self-pay | Admitting: Cardiology

## 2023-12-03 DIAGNOSIS — J301 Allergic rhinitis due to pollen: Secondary | ICD-10-CM | POA: Diagnosis not present

## 2023-12-03 DIAGNOSIS — J479 Bronchiectasis, uncomplicated: Secondary | ICD-10-CM | POA: Diagnosis not present

## 2023-12-03 DIAGNOSIS — J4520 Mild intermittent asthma, uncomplicated: Secondary | ICD-10-CM | POA: Diagnosis not present

## 2023-12-03 DIAGNOSIS — Z79899 Other long term (current) drug therapy: Secondary | ICD-10-CM | POA: Diagnosis not present

## 2023-12-03 DIAGNOSIS — J84112 Idiopathic pulmonary fibrosis: Secondary | ICD-10-CM | POA: Diagnosis not present

## 2023-12-05 NOTE — Progress Notes (Unsigned)
Cardiology Office Note:    Date:  12/05/2023   ID:  Annette Tran, DOB [birth year], MRN [id number]  PCP:  Hurshel Party, NP  Cardiologist:  Norman Herrlich, MD    Referring MD: Hurshel Party, NP    ASSESSMENT:    1. SVT (supraventricular tachycardia) (HCC)   2. Hypercholesterolemia   3. Pulmonary fibrosis ([facility])    PLAN:    In order of problems listed above:  She is due very well in life having no rapid heart rhythms cardiovascular symptoms of angina edema shortness of breath chest pain or syncope Continue her calcium channel blocker well-tolerated Cardizem CD under 20 mL grams daily and furosemide intermittently if she develops peripheral edema. She will continue her statin lipids are ideal 0.0 11/23/2023 cholesterol 213 LDL 112 5 HDL 82 Doing well not having symptoms of shortness of breath   Next appointment: 1 year   Medication Adjustments/Labs and Tests Ordered: Current medicines are reviewed at length with the patient today.  Concerns regarding medicines are outlined above.  No orders of the defined types were placed in this encounter.  No orders of the defined types were placed in this encounter.    History of Present Illness:    Annette Tran is a 78 y.o. female with a hx of SVT controlled with low-dose Cardizem pulmonary fibrosis and hyper lipidemia last seen 12/11/2022 Compliance with diet, lifestyle and medications: Yes  Doing well from a cardiology perspective she has had trouble with a trigger finger in the left hand and clear rotator cuff symptoms of left shoulder and had a steroid injection which is giving her relief Past Medical History:  Diagnosis Date   Arthritis    Asthma    Cataract 04/03/2020   Chest pain 11/08/2012   Chronic bronchitis (HCC) 04/03/2020   Depression    GERD (gastroesophageal reflux disease)    High cholesterol    Hypercholesterolemia 08/16/2017   Multiple food allergies    "caffeine, onions, garlic, peppermint, chocolate" (08/14/2013)    Neuromuscular disorder ([facility])    abd tightness and numbness after c-section   Osteoarthritis    "all over" (08/14/2013)   Osteoarthritis of left knee 08/03/2013   PONV (postoperative nausea and vomiting)    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   Seasonal allergies    Shortness of breath    started 1 mth ago (read note)   Unsteady gait 04/03/2020   Vertigo 04/03/2020    Current Medications: Current Meds  Medication Sig   Cyanocobalamin 5000 MCG CAPS Take 5,000 mg by mouth daily.   lidocaine 4 % Place 1 patch onto the skin daily.   montelukast (SINGULAIR) 10 MG tablet Take 10 mg by mouth daily.   Multiple Vitamins-Minerals (PRESERVISION AREDS 2) CAPS Take 1 capsule by mouth daily.   Vitamin D, Ergocalciferol, (DRISDOL) 1.25 MG (50000 UNIT) CAPS capsule Take 50,000 Units by mouth once a week.      EKGs/Labs/Other Studies Reviewed:    The following studies were reviewed today:  Cardiac Studies & Procedures        MONITORS  LONG TERM MONITOR (3-14 DAYS) 05/28/2022  Narrative Patch Wear Time:  8 days and 5 hours (2023-07-11T14:40:52-0400 to 2023-07-19T20:35:04-399)  Patient had a min HR of 48 bpm, max HR of 231 bpm, and avg HR of 77 bpm. Predominant underlying rhythm was Sinus Rhythm.  There were no episodes of bradycardia sinus arrest secondary third-degree AV block  52 Supraventricular Tachycardia runs occurred, the run with the  fastest interval lasting 5 beats with a max rate of 231 bpm, the longest lasting 34.8 secs with an avg rate of 110 bpm. Some episodes of Supraventricular Tachycardia may be possible Atrial Tachycardia with variable block. Supraventricular Tachycardia was detected within +/- 45 seconds of symptomatic patient event(s).  There were no episodes of atrial fibrillation or flutter  Isolated SVEs were rare (<1.0%), SVE Couplets were rare (<1.0%), and SVE  Triplets were rare (<1.0%). Isolated VEs were rare (<1.0%), and no VE Couplets or  VE Triplets were present.  She had 4 triggered and 9 diary events all sinus rhythm one of them was associated with a brief run of atrial tachycardia          EKG Interpretation Date/Time:  Monday December 06 2023 13:25:42 EST Ventricular Rate:  77 PR Interval:  126 QRS Duration:  90 QT Interval:  362 QTC Calculation: 409 R Axis:   -17  Text Interpretation: Normal sinus rhythm Normal ECG RSR prime V1 When compared with ECG of 08-Nov-2012 14:20, No significant change was found Confirmed by Norman Herrlich ([id number]) on 12/06/2023 1:52:49 PM    Physical Exam:    VS:  There were no vitals taken for this visit.    Wt Readings from Last 3 Encounters:  12/11/22 138 lb 12.8 oz (63 kg)  07/15/22 137 lb (62.1 kg)  06/03/22 138 lb (62.6 kg)     GEN:  Well nourished, well developed in no acute distress HEENT: Normal NECK: No JVD; No carotid bruits LYMPHATICS: No lymphadenopathy CARDIAC: RRR, no murmurs, rubs, gallops RESPIRATORY:  Clear to auscultation without rales, wheezing or rhonchi  ABDOMEN: Soft, non-tender, non-distended MUSCULOSKELETAL:  No edema; No deformity  SKIN: Warm and dry NEUROLOGIC:  Alert and oriented x 3 PSYCHIATRIC:  Normal affect    Signed, Norman Herrlich, MD  12/05/2023 6:20 PM    [facility]

## 2023-12-06 ENCOUNTER — Encounter: Payer: Self-pay | Admitting: Cardiology

## 2023-12-06 ENCOUNTER — Ambulatory Visit: Payer: Medicare HMO | Attending: Cardiology | Admitting: Cardiology

## 2023-12-06 VITALS — BP 136/82 | HR 77 | Ht 64.0 in | Wt 139.2 lb

## 2023-12-06 DIAGNOSIS — J8410 Pulmonary fibrosis, unspecified: Secondary | ICD-10-CM

## 2023-12-06 DIAGNOSIS — I471 Supraventricular tachycardia, unspecified: Secondary | ICD-10-CM

## 2023-12-06 DIAGNOSIS — E7800 Pure hypercholesterolemia, unspecified: Secondary | ICD-10-CM

## 2023-12-06 NOTE — Patient Instructions (Signed)
 Medication Instructions:  Your physician recommends that you continue on your current medications as directed. Please refer to the Current Medication list given to you today.  *If you need a refill on your cardiac medications before your next appointment, please call your pharmacy*   Lab Work: None If you have labs (blood work) drawn today and your tests are completely normal, you will receive your results only by: MyChart Message (if you have MyChart) OR A paper copy in the mail If you have any lab test that is abnormal or we need to change your treatment, we will call you to review the results.   Testing/Procedures: None   Follow-Up: At [facility], you and your health needs are our priority.  As part of our continuing mission to provide you with exceptional heart care, we have created designated Provider Care Teams.  These Care Teams include your primary Cardiologist (physician) and Advanced Practice Providers (APPs -  Physician Assistants and Nurse Practitioners) who all work together to provide you with the care you need, when you need it.  We recommend signing up for the patient portal called "MyChart".  Sign up information is provided on this After Visit Summary.  MyChart is used to connect with patients for Virtual Visits (Telemedicine).  Patients are able to view lab/test results, encounter notes, upcoming appointments, etc.  Non-urgent messages can be sent to your provider as well.   To learn more about what you can do with MyChart, go to [email or web address].    Your next appointment:   1 year(s)  Provider:   Norman Herrlich, MD    Other Instructions None

## 2023-12-24 DIAGNOSIS — R350 Frequency of micturition: Secondary | ICD-10-CM | POA: Diagnosis not present

## 2023-12-24 DIAGNOSIS — R059 Cough, unspecified: Secondary | ICD-10-CM | POA: Diagnosis not present

## 2023-12-24 DIAGNOSIS — U071 COVID-19: Secondary | ICD-10-CM | POA: Diagnosis not present

## 2023-12-24 DIAGNOSIS — J4520 Mild intermittent asthma, uncomplicated: Secondary | ICD-10-CM | POA: Diagnosis not present

## 2024-01-08 DIAGNOSIS — R918 Other nonspecific abnormal finding of lung field: Secondary | ICD-10-CM | POA: Diagnosis not present

## 2024-01-08 DIAGNOSIS — R079 Chest pain, unspecified: Secondary | ICD-10-CM | POA: Diagnosis not present

## 2024-01-08 DIAGNOSIS — R9431 Abnormal electrocardiogram [ECG] [EKG]: Secondary | ICD-10-CM | POA: Diagnosis not present

## 2024-01-10 DIAGNOSIS — R0602 Shortness of breath: Secondary | ICD-10-CM | POA: Diagnosis not present

## 2024-01-10 DIAGNOSIS — J301 Allergic rhinitis due to pollen: Secondary | ICD-10-CM | POA: Diagnosis not present

## 2024-01-10 DIAGNOSIS — J84112 Idiopathic pulmonary fibrosis: Secondary | ICD-10-CM | POA: Diagnosis not present

## 2024-01-10 DIAGNOSIS — J4520 Mild intermittent asthma, uncomplicated: Secondary | ICD-10-CM | POA: Diagnosis not present

## 2024-01-10 DIAGNOSIS — J479 Bronchiectasis, uncomplicated: Secondary | ICD-10-CM | POA: Diagnosis not present

## 2024-01-12 DIAGNOSIS — R0602 Shortness of breath: Secondary | ICD-10-CM | POA: Diagnosis not present

## 2024-01-21 DIAGNOSIS — J479 Bronchiectasis, uncomplicated: Secondary | ICD-10-CM | POA: Diagnosis not present

## 2024-01-21 DIAGNOSIS — J84112 Idiopathic pulmonary fibrosis: Secondary | ICD-10-CM | POA: Diagnosis not present

## 2024-01-21 DIAGNOSIS — J4520 Mild intermittent asthma, uncomplicated: Secondary | ICD-10-CM | POA: Diagnosis not present

## 2024-01-21 DIAGNOSIS — J301 Allergic rhinitis due to pollen: Secondary | ICD-10-CM | POA: Diagnosis not present

## 2024-02-02 DIAGNOSIS — J8410 Pulmonary fibrosis, unspecified: Secondary | ICD-10-CM | POA: Diagnosis not present

## 2024-02-02 DIAGNOSIS — E041 Nontoxic single thyroid nodule: Secondary | ICD-10-CM | POA: Diagnosis not present

## 2024-02-02 DIAGNOSIS — J84112 Idiopathic pulmonary fibrosis: Secondary | ICD-10-CM | POA: Diagnosis not present

## 2024-02-07 DIAGNOSIS — H6122 Impacted cerumen, left ear: Secondary | ICD-10-CM | POA: Diagnosis not present

## 2024-02-07 DIAGNOSIS — H6691 Otitis media, unspecified, right ear: Secondary | ICD-10-CM | POA: Diagnosis not present

## 2024-02-07 DIAGNOSIS — H6121 Impacted cerumen, right ear: Secondary | ICD-10-CM | POA: Diagnosis not present

## 2024-02-11 DIAGNOSIS — H9201 Otalgia, right ear: Secondary | ICD-10-CM | POA: Diagnosis not present

## 2024-03-19 ENCOUNTER — Other Ambulatory Visit: Payer: Self-pay | Admitting: Cardiology

## 2024-04-12 DIAGNOSIS — J301 Allergic rhinitis due to pollen: Secondary | ICD-10-CM | POA: Diagnosis not present

## 2024-04-12 DIAGNOSIS — Z79899 Other long term (current) drug therapy: Secondary | ICD-10-CM | POA: Diagnosis not present

## 2024-04-12 DIAGNOSIS — J84112 Idiopathic pulmonary fibrosis: Secondary | ICD-10-CM | POA: Diagnosis not present

## 2024-04-12 DIAGNOSIS — J4520 Mild intermittent asthma, uncomplicated: Secondary | ICD-10-CM | POA: Diagnosis not present

## 2024-04-12 DIAGNOSIS — J479 Bronchiectasis, uncomplicated: Secondary | ICD-10-CM | POA: Diagnosis not present

## 2024-05-25 DIAGNOSIS — H8113 Benign paroxysmal vertigo, bilateral: Secondary | ICD-10-CM | POA: Diagnosis not present

## 2024-05-25 DIAGNOSIS — M25512 Pain in left shoulder: Secondary | ICD-10-CM | POA: Diagnosis not present

## 2024-05-25 DIAGNOSIS — F411 Generalized anxiety disorder: Secondary | ICD-10-CM | POA: Diagnosis not present

## 2024-05-25 DIAGNOSIS — E785 Hyperlipidemia, unspecified: Secondary | ICD-10-CM | POA: Diagnosis not present

## 2024-05-25 DIAGNOSIS — R399 Unspecified symptoms and signs involving the genitourinary system: Secondary | ICD-10-CM | POA: Diagnosis not present

## 2024-05-25 DIAGNOSIS — R609 Edema, unspecified: Secondary | ICD-10-CM | POA: Diagnosis not present

## 2024-05-25 DIAGNOSIS — M65332 Trigger finger, left middle finger: Secondary | ICD-10-CM | POA: Diagnosis not present

## 2024-05-25 DIAGNOSIS — J8410 Pulmonary fibrosis, unspecified: Secondary | ICD-10-CM | POA: Diagnosis not present

## 2024-05-25 DIAGNOSIS — Z79899 Other long term (current) drug therapy: Secondary | ICD-10-CM | POA: Diagnosis not present

## 2024-05-25 DIAGNOSIS — F320 Major depressive disorder, single episode, mild: Secondary | ICD-10-CM | POA: Diagnosis not present

## 2024-05-25 DIAGNOSIS — J4520 Mild intermittent asthma, uncomplicated: Secondary | ICD-10-CM | POA: Diagnosis not present

## 2024-05-25 DIAGNOSIS — I471 Supraventricular tachycardia, unspecified: Secondary | ICD-10-CM | POA: Diagnosis not present

## 2024-07-17 DIAGNOSIS — R498 Other voice and resonance disorders: Secondary | ICD-10-CM | POA: Diagnosis not present

## 2024-07-17 DIAGNOSIS — J479 Bronchiectasis, uncomplicated: Secondary | ICD-10-CM | POA: Diagnosis not present

## 2024-07-17 DIAGNOSIS — J4520 Mild intermittent asthma, uncomplicated: Secondary | ICD-10-CM | POA: Diagnosis not present

## 2024-07-17 DIAGNOSIS — J84112 Idiopathic pulmonary fibrosis: Secondary | ICD-10-CM | POA: Diagnosis not present

## 2024-11-30 ENCOUNTER — Other Ambulatory Visit: Payer: Self-pay

## 2024-12-01 ENCOUNTER — Ambulatory Visit: Admitting: Cardiology

## 2024-12-06 ENCOUNTER — Ambulatory Visit: Admitting: Cardiology

## 2024-12-06 NOTE — Progress Notes (Unsigned)
 " Cardiology Office Note:    Date:  12/07/2024   ID:  Annette Tran, DOB Apr 12, 1946, MRN [id number]  PCP:  Erick Greig LABOR, NP  Cardiologist:  Redell Leiter, MD    Referring MD: Erick Greig LABOR, NP    ASSESSMENT:    1. SVT (supraventricular tachycardia)   2. Hypercholesterolemia   3. Pulmonary fibrosis (HCC)    PLAN:    In order of problems listed above:  She is doing quite well with her SVT takes a rate slowing calcium channel blocker no episodes continue diltiazem  Continue her current lipid-lowering treatment and atorvastatin managed with her PCP Stable she has been evaluated by pulmonary continue Singulair  bronchodilator   Next appointment: Plan to see her in 1 year   Medication Adjustments/Labs and Tests Ordered: Current medicines are reviewed at length with the patient today.  Concerns regarding medicines are outlined above.  Orders Placed This Encounter  Procedures   EKG 12-Lead   No orders of the defined types were placed in this encounter.    History of Present Illness:    Annette Tran is a 79 y.o. female with a hx of SVT hyperlipidemia and pulmonary fibrosis last seen 12/06/2023. Compliance with diet, lifestyle and medications: Yes  She recently had a fall and she finds herself to be a little bit unsteady it was not syncope palpitation or near syncope. In my office she does not have orthostatic hypotension immediate and 1 minute She has had no episodes of rapid heart rhythm She has had no chest pain edema shortness of breath. Recent labs with her primary care physician reviewed cholesterol 205 LDL 112 hemoglobin 14.2 creatinine 0.66 potassium 4.5 Past Medical History:  Diagnosis Date   Arthritis    Asthma    Cataract 04/03/2020   Chest pain 11/08/2012   Chronic bronchitis (HCC) 04/03/2020   Depression    GERD (gastroesophageal reflux disease)    High cholesterol    Hypercholesterolemia 08/16/2017   Multiple food allergies    caffeine, onions, garlic,  peppermint, chocolate (08/14/2013)   Neuromuscular disorder (HCC)    abd tightness and numbness after c-section   Osteoarthritis    all over (08/14/2013)   Osteoarthritis of left knee 08/03/2013   PONV (postoperative nausea and vomiting)    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   Seasonal allergies    Shortness of breath    started 1 mth ago (read note)   Unsteady gait 04/03/2020   Vertigo 04/03/2020    Current Medications: Active Medications[1]    EKGs/Labs/Other Studies Reviewed:    The following studies were reviewed today:  Cardiac Studies & Procedures   ______________________________________________________________________________________________        SHERRILEE  LONG TERM MONITOR (3-14 DAYS) 05/28/2022  Narrative Patch Wear Time:  8 days and 5 hours (2023-07-11T14:40:52-0400 to 2023-07-19T20:35:04-399)  Patient had a min HR of 48 bpm, max HR of 231 bpm, and avg HR of 77 bpm. Predominant underlying rhythm was Sinus Rhythm.  There were no episodes of bradycardia sinus arrest secondary third-degree AV block  52 Supraventricular Tachycardia runs occurred, the run with the fastest interval lasting 5 beats with a max rate of 231 bpm, the longest lasting 34.8 secs with an avg rate of 110 bpm. Some episodes of Supraventricular Tachycardia may be possible Atrial Tachycardia with variable block. Supraventricular Tachycardia was detected within +/- 45 seconds of symptomatic patient event(s).  There were no episodes of atrial fibrillation or flutter  Isolated SVEs were rare (<1.0%), SVE Couplets  were rare (<1.0%), and SVE  Triplets were rare (<1.0%). Isolated VEs were rare (<1.0%), and no VE Couplets or VE Triplets were present.  She had 4 triggered and 9 diary events all sinus rhythm one of them was associated with a brief run of atrial tachycardia        ______________________________________________________________________________________________      EKG Interpretation Date/Time:  Thursday December 07 2024 15:42:00 EST Ventricular Rate:  84 PR Interval:  140 QRS Duration:  88 QT Interval:  348 QTC Calculation: 411 R Axis:   -27  Text Interpretation: Normal sinus rhythm Minimal voltage criteria for LVH, may be normal variant ( R in aVL ) When compared with ECG of 06-Dec-2023 13:25, No significant change was found Confirmed by Monetta Rogue ([id number]) on 12/07/2024 3:51:34 PM     Physical Exam:    VS:  BP 100/66   Pulse 84   Ht 5' 4 (1.626 m)   Wt 146 lb 4 oz (66.3 kg)   SpO2 95%   BMI 25.10 kg/m     Wt Readings from Last 3 Encounters:  12/07/24 146 lb 4 oz (66.3 kg)  12/06/23 139 lb 3.2 oz (63.1 kg)  12/11/22 138 lb 12.8 oz (63 kg)     GEN:  Well nourished, well developed in no acute distress HEENT: Normal NECK: No JVD; No carotid bruits LYMPHATICS: No lymphadenopathy CARDIAC: RRR, no murmurs, rubs, gallops RESPIRATORY:  Clear to auscultation without rales, wheezing or rhonchi  ABDOMEN: Soft, non-tender, non-distended MUSCULOSKELETAL:  No edema; No deformity  SKIN: Warm and dry NEUROLOGIC:  Alert and oriented x 3 PSYCHIATRIC:  Normal affect    Signed, Rogue Monetta, MD  12/07/2024 5:18 PM    [facility]      [1]  Current Meds  Medication Sig   atorvastatin (LIPITOR) 10 MG tablet Take 10 mg by mouth daily.   Cyanocobalamin  5000 MCG CAPS Take 5,000 mg by mouth daily.   diltiazem  (CARDIZEM  CD) 120 MG 24 hr capsule Take 1 capsule (120 mg total) by mouth daily.   EPINEPHrine  0.3 mg/0.3 mL IJ SOAJ injection Inject 0.3 mg into the muscle as needed.   fluticasone furoate-vilanterol (BREO ELLIPTA) 100-25 MCG/ACT AEPB Inhale 1 puff into the lungs daily.   furosemide (LASIX) 20 MG tablet Take 20 mg by mouth daily as needed for fluid or edema.   meclizine (ANTIVERT) 25 MG tablet Take 25 mg by  mouth 3 (three) times daily as needed for dizziness (Takes 1 tablet daily).   montelukast  (SINGULAIR ) 10 MG tablet Take 10 mg by mouth daily.   SYMBICORT 160-4.5 MCG/ACT inhaler Inhale 2 puffs into the lungs as needed.   Vitamin D, Ergocalciferol, (DRISDOL) 1.25 MG (50000 UNIT) CAPS capsule Take 50,000 Units by mouth once a week.   [DISCONTINUED] lidocaine  4 % Place 1 patch onto the skin daily.   "

## 2024-12-07 ENCOUNTER — Ambulatory Visit: Admitting: Cardiology

## 2024-12-07 ENCOUNTER — Encounter: Payer: Self-pay | Admitting: Cardiology

## 2024-12-07 VITALS — BP 100/66 | HR 84 | Ht 64.0 in | Wt 146.2 lb

## 2024-12-07 DIAGNOSIS — J8410 Pulmonary fibrosis, unspecified: Secondary | ICD-10-CM

## 2024-12-07 DIAGNOSIS — E7800 Pure hypercholesterolemia, unspecified: Secondary | ICD-10-CM | POA: Diagnosis not present

## 2024-12-07 DIAGNOSIS — I471 Supraventricular tachycardia, unspecified: Secondary | ICD-10-CM

## 2024-12-07 NOTE — Patient Instructions (Signed)

## 2024-12-08 ENCOUNTER — Ambulatory Visit: Admitting: Cardiology
